# Patient Record
Sex: Male | Born: 1935 | Race: White | Hispanic: No | Smoking: Former smoker
Health system: Southern US, Community
[De-identification: ages and names within clinical notes are randomized; demographics above are authoritative.]

## PROBLEM LIST (undated history)

## (undated) DIAGNOSIS — Z8679 Personal history of other diseases of the circulatory system: Secondary | ICD-10-CM

## (undated) DIAGNOSIS — W19XXXA Unspecified fall, initial encounter: Secondary | ICD-10-CM

## (undated) DIAGNOSIS — N400 Enlarged prostate without lower urinary tract symptoms: Secondary | ICD-10-CM

## (undated) DIAGNOSIS — Z951 Presence of aortocoronary bypass graft: Secondary | ICD-10-CM

## (undated) DIAGNOSIS — R51 Headache: Secondary | ICD-10-CM

## (undated) DIAGNOSIS — I2510 Atherosclerotic heart disease of native coronary artery without angina pectoris: Secondary | ICD-10-CM

## (undated) DIAGNOSIS — I255 Ischemic cardiomyopathy: Secondary | ICD-10-CM

## (undated) DIAGNOSIS — R413 Other amnesia: Secondary | ICD-10-CM

## (undated) DIAGNOSIS — F0280 Dementia in other diseases classified elsewhere without behavioral disturbance: Secondary | ICD-10-CM

## (undated) DIAGNOSIS — D638 Anemia in other chronic diseases classified elsewhere: Secondary | ICD-10-CM

## (undated) DIAGNOSIS — I25719 Atherosclerosis of autologous vein coronary artery bypass graft(s) with unspecified angina pectoris: Secondary | ICD-10-CM

## (undated) DIAGNOSIS — G4486 Cervicogenic headache: Secondary | ICD-10-CM

## (undated) DIAGNOSIS — R269 Unspecified abnormalities of gait and mobility: Secondary | ICD-10-CM

## (undated) DIAGNOSIS — I10 Essential (primary) hypertension: Secondary | ICD-10-CM

## (undated) DIAGNOSIS — M47812 Spondylosis without myelopathy or radiculopathy, cervical region: Secondary | ICD-10-CM

## (undated) DIAGNOSIS — R296 Repeated falls: Secondary | ICD-10-CM

## (undated) DIAGNOSIS — G4700 Insomnia, unspecified: Secondary | ICD-10-CM

## (undated) DIAGNOSIS — E785 Hyperlipidemia, unspecified: Secondary | ICD-10-CM

## (undated) DIAGNOSIS — I482 Chronic atrial fibrillation, unspecified: Secondary | ICD-10-CM

## (undated) DIAGNOSIS — Z95810 Presence of automatic (implantable) cardiac defibrillator: Secondary | ICD-10-CM

## (undated) DIAGNOSIS — G309 Alzheimer's disease, unspecified: Secondary | ICD-10-CM

## (undated) HISTORY — PX: CORONARY ARTERY BYPASS GRAFT: SHX141

## (undated) HISTORY — DX: Benign prostatic hyperplasia without lower urinary tract symptoms: N40.0

## (undated) HISTORY — DX: Hyperlipidemia, unspecified: E78.5

## (undated) HISTORY — DX: Unspecified abnormalities of gait and mobility: R26.9

## (undated) HISTORY — DX: Essential (primary) hypertension: I10

## (undated) HISTORY — PX: OTHER SURGICAL HISTORY: SHX169

## (undated) HISTORY — DX: Repeated falls: R29.6

## (undated) HISTORY — DX: Other amnesia: R41.3

## (undated) HISTORY — PX: CORONARY STENT PLACEMENT: SHX1402

## (undated) HISTORY — DX: Unspecified fall, initial encounter: W19.XXXA

## (undated) HISTORY — DX: Dementia in other diseases classified elsewhere without behavioral disturbance: F02.80

## (undated) HISTORY — DX: Spondylosis without myelopathy or radiculopathy, cervical region: M47.812

## (undated) HISTORY — DX: Alzheimer's disease, unspecified: G30.9

## (undated) HISTORY — PX: TRANSURETHRAL RESECTION OF PROSTATE: SHX73

## (undated) HISTORY — DX: Cervicogenic headache: G44.86

## (undated) HISTORY — DX: Headache: R51

## (undated) HISTORY — DX: Insomnia, unspecified: G47.00

---

## 1985-12-27 DIAGNOSIS — Z951 Presence of aortocoronary bypass graft: Secondary | ICD-10-CM

## 1985-12-27 DIAGNOSIS — I2510 Atherosclerotic heart disease of native coronary artery without angina pectoris: Secondary | ICD-10-CM

## 1985-12-27 HISTORY — DX: Atherosclerotic heart disease of native coronary artery without angina pectoris: I25.10

## 1985-12-27 HISTORY — DX: Presence of aortocoronary bypass graft: Z95.1

## 2003-08-28 DIAGNOSIS — Z8679 Personal history of other diseases of the circulatory system: Secondary | ICD-10-CM

## 2003-08-28 HISTORY — PX: PACEMAKER PLACEMENT: SHX43

## 2003-08-28 HISTORY — DX: Personal history of other diseases of the circulatory system: Z86.79

## 2005-06-04 ENCOUNTER — Ambulatory Visit: Payer: Self-pay | Admitting: Internal Medicine

## 2005-06-13 ENCOUNTER — Emergency Department: Payer: Self-pay | Admitting: General Practice

## 2005-08-17 ENCOUNTER — Encounter: Payer: Self-pay | Admitting: Internal Medicine

## 2005-08-27 ENCOUNTER — Encounter: Payer: Self-pay | Admitting: Internal Medicine

## 2005-09-26 ENCOUNTER — Encounter: Payer: Self-pay | Admitting: Internal Medicine

## 2005-10-27 ENCOUNTER — Encounter: Payer: Self-pay | Admitting: Internal Medicine

## 2006-10-24 ENCOUNTER — Ambulatory Visit: Payer: Self-pay | Admitting: Gastroenterology

## 2007-02-20 ENCOUNTER — Inpatient Hospital Stay: Payer: Self-pay | Admitting: Internal Medicine

## 2007-02-20 ENCOUNTER — Other Ambulatory Visit: Payer: Self-pay

## 2008-01-11 ENCOUNTER — Inpatient Hospital Stay: Payer: Self-pay | Admitting: Internal Medicine

## 2008-01-11 ENCOUNTER — Other Ambulatory Visit: Payer: Self-pay

## 2009-09-22 ENCOUNTER — Inpatient Hospital Stay: Payer: Self-pay | Admitting: Otolaryngology

## 2010-02-02 ENCOUNTER — Ambulatory Visit: Payer: Self-pay | Admitting: Internal Medicine

## 2011-04-14 ENCOUNTER — Ambulatory Visit: Payer: Self-pay | Admitting: Internal Medicine

## 2011-11-04 ENCOUNTER — Ambulatory Visit: Payer: Self-pay | Admitting: Gastroenterology

## 2011-11-08 LAB — PATHOLOGY REPORT

## 2011-12-28 HISTORY — PX: CHOLECYSTECTOMY: SHX55

## 2012-01-06 ENCOUNTER — Ambulatory Visit: Payer: Self-pay | Admitting: Cardiology

## 2012-01-06 LAB — CBC WITH DIFFERENTIAL/PLATELET
Basophil #: 0 10*3/uL (ref 0.0–0.1)
HCT: 35.4 % — ABNORMAL LOW (ref 40.0–52.0)
Lymphocyte #: 1.4 10*3/uL (ref 1.0–3.6)
MCHC: 34.1 g/dL (ref 32.0–36.0)
Monocyte #: 0.6 10*3/uL (ref 0.0–0.7)
Neutrophil #: 3.5 10*3/uL (ref 1.4–6.5)
Platelet: 181 10*3/uL (ref 150–440)
RDW: 13.3 % (ref 11.5–14.5)
WBC: 6 10*3/uL (ref 3.8–10.6)

## 2012-01-06 LAB — BASIC METABOLIC PANEL
Anion Gap: 8 (ref 7–16)
BUN: 20 mg/dL — ABNORMAL HIGH (ref 7–18)
Calcium, Total: 9.1 mg/dL (ref 8.5–10.1)
Chloride: 106 mmol/L (ref 98–107)
Creatinine: 1.22 mg/dL (ref 0.60–1.30)
EGFR (African American): >60
EGFR (Non-African Amer.): >60
Glucose: 95 mg/dL (ref 65–99)

## 2012-01-10 ENCOUNTER — Ambulatory Visit: Payer: Self-pay | Admitting: Cardiology

## 2012-06-16 ENCOUNTER — Ambulatory Visit: Payer: Self-pay | Admitting: Internal Medicine

## 2012-07-26 LAB — CBC
HGB: 13.5 g/dL (ref 13.0–18.0)
MCH: 31.6 pg (ref 26.0–34.0)
MCV: 90 fL (ref 80–100)
Platelet: 255 10*3/uL (ref 150–440)
RDW: 13.2 % (ref 11.5–14.5)

## 2012-07-26 LAB — COMPREHENSIVE METABOLIC PANEL
Anion Gap: 6 — ABNORMAL LOW (ref 7–16)
Bilirubin,Total: 1.4 mg/dL — ABNORMAL HIGH (ref 0.2–1.0)
Chloride: 99 mmol/L (ref 98–107)
Co2: 30 mmol/L (ref 21–32)
Creatinine: 1.3 mg/dL (ref 0.60–1.30)
EGFR (African American): >60
EGFR (Non-African Amer.): 52 — ABNORMAL LOW
Osmolality: 274 (ref 275–301)
Potassium: 4.2 mmol/L (ref 3.5–5.1)
SGOT(AST): 26 U/L (ref 15–37)
SGPT (ALT): 26 U/L
Sodium: 135 mmol/L — ABNORMAL LOW (ref 136–145)

## 2012-07-26 LAB — URINALYSIS, COMPLETE
Bacteria: NONE SEEN
Blood: NEGATIVE
Glucose,UR: NEGATIVE mg/dL (ref 0–75)
Hyaline Cast: 8
Specific Gravity: 1.021 (ref 1.003–1.030)
WBC UR: 2 /HPF (ref 0–5)

## 2012-07-26 LAB — LIPASE, BLOOD: Lipase: 115 U/L (ref 73–393)

## 2012-07-26 LAB — CK TOTAL AND CKMB (NOT AT ARMC): CK-MB: 4.9 ng/mL — ABNORMAL HIGH (ref 0.5–3.6)

## 2012-07-27 ENCOUNTER — Inpatient Hospital Stay: Payer: Self-pay | Admitting: Surgery

## 2012-07-27 LAB — CK TOTAL AND CKMB (NOT AT ARMC)
CK, Total: 322 U/L — ABNORMAL HIGH (ref 35–232)
CK-MB: 4.4 ng/mL — ABNORMAL HIGH (ref 0.5–3.6)
CK-MB: 4.1 ng/mL — ABNORMAL HIGH (ref 0.5–3.6)

## 2012-07-27 LAB — TROPONIN I
Troponin-I: 0.06 ng/mL — ABNORMAL HIGH
Troponin-I: 0.06 ng/mL — ABNORMAL HIGH

## 2012-07-28 LAB — COMPREHENSIVE METABOLIC PANEL
Alkaline Phosphatase: 95 U/L (ref 50–136)
Anion Gap: 9 (ref 7–16)
Calcium, Total: 8 mg/dL — ABNORMAL LOW (ref 8.5–10.1)
Co2: 26 mmol/L (ref 21–32)
EGFR (Non-African Amer.): >60
Osmolality: 284 (ref 275–301)
Potassium: 3.6 mmol/L (ref 3.5–5.1)
SGPT (ALT): 21 U/L
Sodium: 143 mmol/L (ref 136–145)

## 2012-07-28 LAB — CBC WITH DIFFERENTIAL/PLATELET
Basophil #: 0.1 10*3/uL (ref 0.0–0.1)
Basophil %: 0.9 %
Eosinophil #: 0.1 10*3/uL (ref 0.0–0.7)
Eosinophil %: 2.6 %
HCT: 34.3 % — ABNORMAL LOW (ref 40.0–52.0)
HGB: 12.3 g/dL — ABNORMAL LOW (ref 13.0–18.0)
Lymphocyte #: 1.4 10*3/uL (ref 1.0–3.6)
Lymphocyte %: 24.5 %
MCH: 32.1 pg (ref 26.0–34.0)
MCHC: 35.8 g/dL (ref 32.0–36.0)
MCV: 90 fL (ref 80–100)
Neutrophil %: 60.4 %
Platelet: 182 10*3/uL (ref 150–440)
RBC: 3.82 10*6/uL — ABNORMAL LOW (ref 4.40–5.90)
RDW: 13 % (ref 11.5–14.5)
WBC: 5.6 10*3/uL (ref 3.8–10.6)

## 2012-07-28 LAB — MAGNESIUM: Magnesium: 1.8 mg/dL

## 2012-07-30 LAB — CBC WITH DIFFERENTIAL/PLATELET
Basophil %: 1.2 %
Eosinophil %: 4.3 %
HCT: 37 % — ABNORMAL LOW (ref 40.0–52.0)
HGB: 13.2 g/dL (ref 13.0–18.0)
Lymphocyte %: 30 %
MCH: 31.8 pg (ref 26.0–34.0)
MCHC: 35.6 g/dL (ref 32.0–36.0)
Monocyte %: 10.6 %
Neutrophil #: 4 10*3/uL (ref 1.4–6.5)
Neutrophil %: 53.9 %
Platelet: 184 10*3/uL (ref 150–440)
RBC: 4.13 10*6/uL — ABNORMAL LOW (ref 4.40–5.90)
WBC: 7.4 10*3/uL (ref 3.8–10.6)

## 2012-07-30 LAB — COMPREHENSIVE METABOLIC PANEL
Alkaline Phosphatase: 95 U/L (ref 50–136)
Bilirubin,Total: 0.8 mg/dL (ref 0.2–1.0)
Calcium, Total: 8.3 mg/dL — ABNORMAL LOW (ref 8.5–10.1)
Chloride: 108 mmol/L — ABNORMAL HIGH (ref 98–107)
Co2: 26 mmol/L (ref 21–32)
Creatinine: 0.88 mg/dL (ref 0.60–1.30)
EGFR (African American): >60
EGFR (Non-African Amer.): >60
Potassium: 3.3 mmol/L — ABNORMAL LOW (ref 3.5–5.1)
SGOT(AST): 27 U/L (ref 15–37)
SGPT (ALT): 21 U/L (ref 12–78)
Total Protein: 5.8 g/dL — ABNORMAL LOW (ref 6.4–8.2)

## 2012-07-31 LAB — BASIC METABOLIC PANEL
Anion Gap: 8 (ref 7–16)
Calcium, Total: 8.8 mg/dL (ref 8.5–10.1)
Chloride: 108 mmol/L — ABNORMAL HIGH (ref 98–107)
Co2: 24 mmol/L (ref 21–32)
EGFR (African American): >60
Osmolality: 278 (ref 275–301)

## 2012-08-02 LAB — COMPREHENSIVE METABOLIC PANEL
Anion Gap: 7 (ref 7–16)
BUN: 10 mg/dL (ref 7–18)
Calcium, Total: 8.4 mg/dL — ABNORMAL LOW (ref 8.5–10.1)
Chloride: 105 mmol/L (ref 98–107)
EGFR (African American): >60
Glucose: 78 mg/dL (ref 65–99)
Potassium: 4.3 mmol/L (ref 3.5–5.1)
SGOT(AST): 25 U/L (ref 15–37)
Sodium: 139 mmol/L (ref 136–145)
Total Protein: 5.9 g/dL — ABNORMAL LOW (ref 6.4–8.2)

## 2012-08-02 LAB — CBC WITH DIFFERENTIAL/PLATELET
Basophil #: 0.2 10*3/uL — ABNORMAL HIGH (ref 0.0–0.1)
Eosinophil #: 0.2 10*3/uL (ref 0.0–0.7)
HCT: 37.2 % — ABNORMAL LOW (ref 40.0–52.0)
HGB: 13 g/dL (ref 13.0–18.0)
Lymphocyte #: 0.7 10*3/uL — ABNORMAL LOW (ref 1.0–3.6)
MCH: 32.1 pg (ref 26.0–34.0)
MCHC: 35 g/dL (ref 32.0–36.0)
MCV: 92 fL (ref 80–100)
Monocyte #: 0.8 x10 3/mm (ref 0.2–1.0)
Monocyte %: 9.8 %
Neutrophil #: 6.5 10*3/uL (ref 1.4–6.5)
Platelet: 155 10*3/uL (ref 150–440)
RDW: 13.5 % (ref 11.5–14.5)
WBC: 8.4 10*3/uL (ref 3.8–10.6)

## 2012-08-02 LAB — PATHOLOGY REPORT

## 2012-08-04 LAB — CBC WITH DIFFERENTIAL/PLATELET
Basophil %: 1 %
Eosinophil %: 8.9 %
HCT: 33.4 % — ABNORMAL LOW (ref 40.0–52.0)
Lymphocyte #: 1 10*3/uL (ref 1.0–3.6)
MCH: 32.5 pg (ref 26.0–34.0)
MCHC: 35.9 g/dL (ref 32.0–36.0)
Monocyte #: 0.8 x10 3/mm (ref 0.2–1.0)
Monocyte %: 15 %
Neutrophil #: 3 10*3/uL (ref 1.4–6.5)
Neutrophil %: 56.1 %
Platelet: 169 10*3/uL (ref 150–440)
RDW: 13.5 % (ref 11.5–14.5)
WBC: 5.3 10*3/uL (ref 3.8–10.6)

## 2012-08-04 LAB — BASIC METABOLIC PANEL
Calcium, Total: 8.4 mg/dL — ABNORMAL LOW (ref 8.5–10.1)
Chloride: 103 mmol/L (ref 98–107)
Co2: 27 mmol/L (ref 21–32)
EGFR (African American): >60
EGFR (Non-African Amer.): >60
Potassium: 4.5 mmol/L (ref 3.5–5.1)
Sodium: 136 mmol/L (ref 136–145)

## 2012-08-04 LAB — HEPATIC FUNCTION PANEL A (ARMC)
Bilirubin,Total: 0.8 mg/dL (ref 0.2–1.0)
SGOT(AST): 23 U/L (ref 15–37)

## 2012-08-04 LAB — LIPASE, BLOOD: Lipase: 160 U/L

## 2012-08-08 ENCOUNTER — Encounter: Payer: Self-pay | Admitting: Internal Medicine

## 2012-08-27 ENCOUNTER — Encounter: Payer: Self-pay | Admitting: Internal Medicine

## 2012-09-19 ENCOUNTER — Ambulatory Visit: Payer: Self-pay | Admitting: Urology

## 2013-02-04 ENCOUNTER — Emergency Department: Payer: Self-pay | Admitting: Emergency Medicine

## 2013-02-04 LAB — CBC
HCT: 37.6 % — ABNORMAL LOW (ref 40.0–52.0)
HGB: 12.7 g/dL — ABNORMAL LOW (ref 13.0–18.0)
MCH: 29.8 pg (ref 26.0–34.0)
MCHC: 33.8 g/dL (ref 32.0–36.0)
MCV: 88 fL (ref 80–100)
RBC: 4.27 10*6/uL — ABNORMAL LOW (ref 4.40–5.90)
RDW: 16 % — ABNORMAL HIGH (ref 11.5–14.5)

## 2013-02-04 LAB — COMPREHENSIVE METABOLIC PANEL
Albumin: 3.4 g/dL (ref 3.4–5.0)
Alkaline Phosphatase: 101 U/L (ref 50–136)
Bilirubin,Total: 0.9 mg/dL (ref 0.2–1.0)
Chloride: 108 mmol/L — ABNORMAL HIGH (ref 98–107)
EGFR (African American): >60
EGFR (Non-African Amer.): >60
Glucose: 85 mg/dL (ref 65–99)
Osmolality: 281 (ref 275–301)
Potassium: 3.8 mmol/L (ref 3.5–5.1)
SGOT(AST): 24 U/L (ref 15–37)
SGPT (ALT): 29 U/L (ref 12–78)

## 2013-02-04 LAB — URINALYSIS, COMPLETE
Bacteria: NONE SEEN
Bilirubin,UR: NEGATIVE
Ph: 5 (ref 4.5–8.0)
Protein: NEGATIVE
Specific Gravity: 1.005 (ref 1.003–1.030)

## 2013-02-04 LAB — TROPONIN I: Troponin-I: <0.02 ng/mL

## 2013-05-22 ENCOUNTER — Other Ambulatory Visit: Payer: Self-pay

## 2013-05-22 MED ORDER — DONEPEZIL HCL 5 MG PO TABS: 5.0000 mg | ORAL_TABLET | Freq: Every evening | ORAL | Status: DC

## 2013-07-31 ENCOUNTER — Ambulatory Visit: Payer: Self-pay | Admitting: Ophthalmology

## 2013-08-06 ENCOUNTER — Ambulatory Visit: Payer: Self-pay | Admitting: Ophthalmology

## 2013-09-21 ENCOUNTER — Ambulatory Visit: Payer: Self-pay | Admitting: Urology

## 2014-02-20 ENCOUNTER — Ambulatory Visit: Payer: Self-pay | Admitting: Ophthalmology

## 2014-03-04 ENCOUNTER — Ambulatory Visit: Payer: Self-pay | Admitting: Ophthalmology

## 2014-03-25 ENCOUNTER — Telehealth: Payer: Self-pay | Admitting: Neurology

## 2014-03-25 MED ORDER — DONEPEZIL HCL 5 MG PO TABS: 5.0000 mg | ORAL_TABLET | Freq: Every evening | ORAL | Status: DC

## 2014-03-25 NOTE — Telephone Encounter (Signed)
Rx has been sent.  I called the patient back 3 times.  The line was busy each time.

## 2014-03-25 NOTE — Telephone Encounter (Signed)
Scheduled apt for pt in July needs refill on medication donepezil (ARICEPT) 5 MG tablet. Please call once this has been sent to pharmacy and pharmacy is the same as listed. Thanks

## 2014-04-11 ENCOUNTER — Encounter: Payer: Self-pay | Admitting: Neurology

## 2014-04-17 ENCOUNTER — Telehealth: Payer: Self-pay | Admitting: Neurology

## 2014-04-17 NOTE — Telephone Encounter (Signed)
Patient's son calling to request a written diagnosis for patient. Please call and advise.

## 2014-04-18 ENCOUNTER — Encounter: Payer: Self-pay | Admitting: Neurology

## 2014-04-18 NOTE — Telephone Encounter (Signed)
I will write a letter. I called the son. He'll come by and pick up a letter.

## 2014-04-18 NOTE — Telephone Encounter (Signed)
Pt's son Aurther Lofterry calling requesting a written diagnosis on the pt. Please advise

## 2014-07-03 ENCOUNTER — Ambulatory Visit: Payer: Self-pay | Admitting: Neurology

## 2014-09-04 ENCOUNTER — Inpatient Hospital Stay: Payer: Self-pay | Admitting: Internal Medicine

## 2014-09-04 LAB — CBC
HCT: 43 % (ref 40.0–52.0)
HGB: 14.3 g/dL (ref 13.0–18.0)
MCH: 31.5 pg (ref 26.0–34.0)
MCHC: 33.3 g/dL (ref 32.0–36.0)
MCV: 95 fL (ref 80–100)
PLATELETS: 152 10*3/uL (ref 150–440)
RBC: 4.54 10*6/uL (ref 4.40–5.90)
RDW: 13.4 % (ref 11.5–14.5)
WBC: 7.6 10*3/uL (ref 3.8–10.6)

## 2014-09-04 LAB — COMPREHENSIVE METABOLIC PANEL
ALK PHOS: 102 U/L
ANION GAP: 5 — AB (ref 7–16)
Albumin: 3.5 g/dL (ref 3.4–5.0)
BUN: 15 mg/dL (ref 7–18)
Bilirubin,Total: 0.9 mg/dL (ref 0.2–1.0)
CALCIUM: 9.4 mg/dL (ref 8.5–10.1)
CO2: 31 mmol/L (ref 21–32)
Chloride: 101 mmol/L (ref 98–107)
Creatinine: 1.03 mg/dL (ref 0.60–1.30)
EGFR (African American): >60
EGFR (Non-African Amer.): >60
Glucose: 113 mg/dL — ABNORMAL HIGH (ref 65–99)
Osmolality: 275 (ref 275–301)
POTASSIUM: 3.6 mmol/L (ref 3.5–5.1)
SGOT(AST): 16 U/L (ref 15–37)
SGPT (ALT): 27 U/L
Sodium: 137 mmol/L (ref 136–145)
TOTAL PROTEIN: 6.7 g/dL (ref 6.4–8.2)

## 2014-09-04 LAB — PROTIME-INR
INR: 0.9
PROTHROMBIN TIME: 12.5 s (ref 11.5–14.7)

## 2014-09-04 LAB — CK-MB
CK-MB: 2.6 ng/mL (ref 0.5–3.6)
CK-MB: 3.4 ng/mL (ref 0.5–3.6)
CK-MB: 4.8 ng/mL — AB (ref 0.5–3.6)

## 2014-09-04 LAB — TROPONIN I
TROPONIN-I: 0.05 ng/mL
Troponin-I: 0.15 ng/mL — ABNORMAL HIGH
Troponin-I: 0.18 ng/mL — ABNORMAL HIGH

## 2014-09-04 LAB — APTT: ACTIVATED PTT: 30.1 s (ref 23.6–35.9)

## 2014-09-05 LAB — PROTIME-INR
INR: 1
Prothrombin Time: 13.3 secs (ref 11.5–14.7)

## 2014-09-05 LAB — BASIC METABOLIC PANEL
ANION GAP: 9 (ref 7–16)
BUN: 15 mg/dL (ref 7–18)
CALCIUM: 9.4 mg/dL (ref 8.5–10.1)
Chloride: 104 mmol/L (ref 98–107)
Co2: 28 mmol/L (ref 21–32)
Creatinine: 1.13 mg/dL (ref 0.60–1.30)
EGFR (African American): >60
EGFR (Non-African Amer.): >60
Glucose: 94 mg/dL (ref 65–99)
OSMOLALITY: 282 (ref 275–301)
POTASSIUM: 3.8 mmol/L (ref 3.5–5.1)
SODIUM: 141 mmol/L (ref 136–145)

## 2014-09-05 LAB — CBC WITH DIFFERENTIAL/PLATELET
BASOS PCT: 2.3 %
Basophil #: 0.2 10*3/uL — ABNORMAL HIGH (ref 0.0–0.1)
EOS ABS: 0.6 10*3/uL (ref 0.0–0.7)
EOS PCT: 8.5 %
HCT: 39.7 % — ABNORMAL LOW (ref 40.0–52.0)
HGB: 13.3 g/dL (ref 13.0–18.0)
LYMPHS PCT: 24.6 %
Lymphocyte #: 1.9 10*3/uL (ref 1.0–3.6)
MCH: 31.4 pg (ref 26.0–34.0)
MCHC: 33.4 g/dL (ref 32.0–36.0)
MCV: 94 fL (ref 80–100)
MONOS PCT: 13.7 %
Monocyte #: 1 x10 3/mm (ref 0.2–1.0)
NEUTROS ABS: 3.8 10*3/uL (ref 1.4–6.5)
Neutrophil %: 50.9 %
Platelet: 151 10*3/uL (ref 150–440)
RBC: 4.22 10*6/uL — ABNORMAL LOW (ref 4.40–5.90)
RDW: 13.2 % (ref 11.5–14.5)
WBC: 7.6 10*3/uL (ref 3.8–10.6)

## 2014-09-05 LAB — HEPARIN LEVEL (UNFRACTIONATED): Anti-Xa(Unfractionated): 0.58 IU/mL (ref 0.30–0.70)

## 2014-09-06 ENCOUNTER — Ambulatory Visit: Payer: Self-pay | Admitting: Neurology

## 2014-09-06 LAB — BASIC METABOLIC PANEL
Anion Gap: 7 (ref 7–16)
BUN: 17 mg/dL (ref 7–18)
CALCIUM: 8.6 mg/dL (ref 8.5–10.1)
Chloride: 103 mmol/L (ref 98–107)
Co2: 29 mmol/L (ref 21–32)
Creatinine: 1.19 mg/dL (ref 0.60–1.30)
GFR CALC NON AF AMER: 57 — AB
Glucose: 97 mg/dL (ref 65–99)
Osmolality: 279 (ref 275–301)
POTASSIUM: 4.3 mmol/L (ref 3.5–5.1)
Sodium: 139 mmol/L (ref 136–145)

## 2014-09-06 LAB — CBC WITH DIFFERENTIAL/PLATELET
BASOS ABS: 0.1 10*3/uL (ref 0.0–0.1)
BASOS PCT: 1.3 %
EOS PCT: 6 %
Eosinophil #: 0.6 10*3/uL (ref 0.0–0.7)
HCT: 43.3 % (ref 40.0–52.0)
HGB: 15.2 g/dL (ref 13.0–18.0)
Lymphocyte #: 1.7 10*3/uL (ref 1.0–3.6)
Lymphocyte %: 16.3 %
MCH: 32.4 pg (ref 26.0–34.0)
MCHC: 35 g/dL (ref 32.0–36.0)
MCV: 93 fL (ref 80–100)
MONO ABS: 0.9 x10 3/mm (ref 0.2–1.0)
Monocyte %: 8.6 %
NEUTROS PCT: 67.8 %
Neutrophil #: 7 10*3/uL — ABNORMAL HIGH (ref 1.4–6.5)
PLATELETS: 158 10*3/uL (ref 150–440)
RBC: 4.68 10*6/uL (ref 4.40–5.90)
RDW: 13.6 % (ref 11.5–14.5)
WBC: 10.3 10*3/uL (ref 3.8–10.6)

## 2014-09-06 LAB — HEPARIN LEVEL (UNFRACTIONATED)

## 2014-09-24 ENCOUNTER — Encounter: Payer: Self-pay | Admitting: Neurology

## 2014-09-24 ENCOUNTER — Encounter (INDEPENDENT_AMBULATORY_CARE_PROVIDER_SITE_OTHER): Payer: Self-pay

## 2014-09-24 ENCOUNTER — Ambulatory Visit (INDEPENDENT_AMBULATORY_CARE_PROVIDER_SITE_OTHER): Payer: Commercial Managed Care - HMO | Admitting: Neurology

## 2014-09-24 VITALS — BP 135/64 | HR 87 | Ht 68.7 in | Wt 152.0 lb

## 2014-09-24 DIAGNOSIS — R413 Other amnesia: Secondary | ICD-10-CM

## 2014-09-24 HISTORY — DX: Other amnesia: R41.3

## 2014-09-24 MED ORDER — DONEPEZIL HCL 10 MG PO TABS: 10.0000 mg | ORAL_TABLET | Freq: Every day | ORAL | Status: DC

## 2014-09-24 NOTE — Progress Notes (Signed)
Reason for visit: Memory disturbance  Nathan Saunders is an 80 y.o. male  History of present illness:  Nathan Saunders is an 80 year old left-handed white male with a history of a progressive memory disorder. The patient was last seen through this office on 07/07/2012. He was placed on Aricept at 5 mg dose, but he has not returned to the office since that time. The patient lives at home, and his son lives with him. He needs assistance with keeping up with medications and appointments, and with financial issues. The patient apparently still operates a motor vehicle without difficulty. The patient will drive to church and to see his sister. The patient comes to the office with his son who indicates that his driving safety is not an issue. The patient is tolerating the Aricept well, and the patient remains active doing yard work and housework. There has been no family history of memory disorders.  Past Medical History  Diagnosis Date  . Memory disturbance     progressive  . Hypertension   . Dyslipidemia   . CAD (coronary artery disease)   . Atrial fibrillation   . Benign enlargement of prostate   . Insomnia   . Cervical spondylosis   . Gait disturbance   . Cervicogenic headache   . Memory difficulty 09/24/2014    Past Surgical History  Procedure Laterality Date  . Coronary artery bypass graft    . Transurethral resection of prostate    . Hernia repair Bilateral   . Coronary stent placement    . Pacemaker placement      Family History  Problem Relation Age of Onset  . Heart disease Mother   . Heart disease Father   . Cancer Brother   . Dementia Neg Hx   . Heart disease Brother     Social history:  reports that he has quit smoking. His smoking use included Cigarettes. He has a 15 pack-year smoking history. He has never used smokeless tobacco. He reports that he does not drink alcohol or use illicit drugs.    Allergies  Allergen Reactions  . Serotonin Reuptake Inhibitors  (Ssris)     Other reaction(s): Other (See Comments) Nightmares    Medications:  No current outpatient prescriptions on file prior to visit.   No current facility-administered medications on file prior to visit.    ROS:  Out of a complete 14 system review of symptoms, the patient complains only of the following symptoms, and all other reviewed systems are negative.  Chest pain Memory loss  Blood pressure 135/64, pulse 87, height 5' 8.7" (1.745 m), weight 152 lb (68.947 kg).  Physical Exam  General: The patient is alert and cooperative at the time of the examination.  Skin: No significant peripheral edema is noted.   Neurologic Exam  Mental status: The Mini-Mental status examination done today shows a total score of 15/30.  Cranial nerves: Facial symmetry is present. Speech is normal, no aphasia or dysarthria is noted. Extraocular movements are full. Visual fields are full.  Motor: The patient has good strength in all 4 extremities.  Sensory examination: Soft touch sensation is symmetric on the face, arms, and legs.  Coordination: The patient has good finger-nose-finger bilaterally. The patient has significant apraxia with the use of the lower extremities, unable to perform heel-to-shin on either side.  Gait and station: The patient has a normal gait. Tandem gait is slightly unsteady. Romberg is negative. No drift is seen.  Reflexes: Deep tendon reflexes are  symmetric.   Assessment/Plan:  1. Progressive memory disorder  The patient will continue the Aricept, but the dose will be increased to 10 mg nightly dose. The patient will followup through this office in 6-8 months. I have indicated that the driving issue needs to be scrutinized closely, and this activity should be withdrawn at the first sign of problems. The patient will call our office if he is not tolerating higher dose of Aricept.  Marlan Palau MD 09/24/2014 7:17 PM  [facility] [address] [place], [place] [address]  Phone [phone] Fax [phone]

## 2014-11-13 ENCOUNTER — Encounter: Payer: Self-pay | Admitting: Neurology

## 2014-11-19 ENCOUNTER — Encounter: Payer: Self-pay | Admitting: Neurology

## 2015-01-25 ENCOUNTER — Ambulatory Visit: Payer: Self-pay | Admitting: Family Medicine

## 2015-01-25 DIAGNOSIS — S0990XA Unspecified injury of head, initial encounter: Secondary | ICD-10-CM | POA: Diagnosis not present

## 2015-01-25 DIAGNOSIS — S0512XA Contusion of eyeball and orbital tissues, left eye, initial encounter: Secondary | ICD-10-CM | POA: Diagnosis not present

## 2015-01-25 DIAGNOSIS — R220 Localized swelling, mass and lump, head: Secondary | ICD-10-CM | POA: Diagnosis not present

## 2015-01-28 DIAGNOSIS — I482 Chronic atrial fibrillation: Secondary | ICD-10-CM | POA: Diagnosis not present

## 2015-01-28 DIAGNOSIS — I25709 Atherosclerosis of coronary artery bypass graft(s), unspecified, with unspecified angina pectoris: Secondary | ICD-10-CM | POA: Diagnosis not present

## 2015-01-28 DIAGNOSIS — R4182 Altered mental status, unspecified: Secondary | ICD-10-CM | POA: Diagnosis not present

## 2015-01-28 DIAGNOSIS — E782 Mixed hyperlipidemia: Secondary | ICD-10-CM | POA: Diagnosis not present

## 2015-02-03 DIAGNOSIS — I25709 Atherosclerosis of coronary artery bypass graft(s), unspecified, with unspecified angina pectoris: Secondary | ICD-10-CM | POA: Diagnosis not present

## 2015-02-03 DIAGNOSIS — E782 Mixed hyperlipidemia: Secondary | ICD-10-CM | POA: Diagnosis not present

## 2015-02-03 DIAGNOSIS — I10 Essential (primary) hypertension: Secondary | ICD-10-CM | POA: Diagnosis not present

## 2015-02-03 DIAGNOSIS — I482 Chronic atrial fibrillation: Secondary | ICD-10-CM | POA: Diagnosis not present

## 2015-02-07 DIAGNOSIS — R079 Chest pain, unspecified: Secondary | ICD-10-CM | POA: Diagnosis not present

## 2015-02-07 DIAGNOSIS — I10 Essential (primary) hypertension: Secondary | ICD-10-CM | POA: Diagnosis not present

## 2015-02-07 DIAGNOSIS — I25709 Atherosclerosis of coronary artery bypass graft(s), unspecified, with unspecified angina pectoris: Secondary | ICD-10-CM | POA: Diagnosis not present

## 2015-02-07 DIAGNOSIS — E782 Mixed hyperlipidemia: Secondary | ICD-10-CM | POA: Diagnosis not present

## 2015-02-20 DIAGNOSIS — I442 Atrioventricular block, complete: Secondary | ICD-10-CM | POA: Diagnosis not present

## 2015-03-25 ENCOUNTER — Ambulatory Visit: Payer: Commercial Managed Care - HMO | Admitting: Neurology

## 2015-03-27 ENCOUNTER — Telehealth: Payer: Self-pay

## 2015-03-27 NOTE — Telephone Encounter (Signed)
Left voicemail for patient to call back to schedule an appointment with Aundra MilletMegan, NP.

## 2015-03-31 NOTE — Telephone Encounter (Signed)
Appointment scheduled for 04/18/15 °

## 2015-04-01 DIAGNOSIS — I10 Essential (primary) hypertension: Secondary | ICD-10-CM | POA: Diagnosis not present

## 2015-04-01 DIAGNOSIS — Z79899 Other long term (current) drug therapy: Secondary | ICD-10-CM | POA: Diagnosis not present

## 2015-04-01 DIAGNOSIS — E782 Mixed hyperlipidemia: Secondary | ICD-10-CM | POA: Diagnosis not present

## 2015-04-01 DIAGNOSIS — I25709 Atherosclerosis of coronary artery bypass graft(s), unspecified, with unspecified angina pectoris: Secondary | ICD-10-CM | POA: Diagnosis not present

## 2015-04-01 DIAGNOSIS — R0602 Shortness of breath: Secondary | ICD-10-CM | POA: Diagnosis not present

## 2015-04-15 NOTE — Op Note (Signed)
PATIENT NAME:  [name], [name] MR#:  [id number] DATE OF BIRTH:  1932-08-04  DATE OF PROCEDURE:  07/31/2012  PREOPERATIVE DIAGNOSIS: Chronic cholecystitis and cholelithiasis.   POSTOPERATIVE DIAGNOSIS: Chronic cholecystitis and cholelithiasis.  OPERATION: Laparoscopic cholecystectomy with cholangiography.   SURGEON: Quentin Orealph L. Ely, III, MD   ANESTHESIA: General.   OPERATIVE PROCEDURE: With the patient in the supine position after the induction of appropriate general anesthesia, the patient's abdomen was prepped with ChloraPrep and draped with sterile towels. An infraumbilical incision was made in the standard fashion, carried down bluntly through the subcutaneous tissue. Veress needle was used to cannulate the peritoneal cavity and CO2 was insufflated to appropriate pressure measurements. When approximately 2.5 liters of CO2 were instilled, the Veress needle was withdrawn. An 11 mm [employer] port was inserted into the peritoneal cavity. Intraperitoneal position was confirmed and CO2 was re-insufflated. The patient was placed in the head up, feet down position, rotated slightly to the left side. A subxiphoid transverse incision was made and an 11 mm port was inserted under direct vision. Two lateral ports 5 mm in size were inserted under direct vision. The gallbladder was markedly distended and discolored. Several adhesions were taken down with a combination of blunt and Bovie dissection. The gallbladder was retracted superiorly and laterally exposing the hepatoduodenal ligament. Cystic artery and cystic duct were identified. Cystic duct was clipped on the gallbladder side and opened. An on table cholangiogram using dynamic fluoroscopy revealed free flow of dye into the duodenum. Intrahepatic radicles were seen. No obstruction was identified. Catheter was withdrawn. The cystic duct was doubly clipped on the common duct side and divided. Cystic artery was doubly clipped and divided. The gallbladder was  then dissected free from its bed and delivered using hook and cautery apparatus. Once the gallbladder was free, the camera was switched to the subxiphoid port and the gallbladder was brought through the umbilical port using the EndoCatch apparatus. The area was copiously suctioned and irrigated. There did not appear to be any significant bleeding problems from the bed of the liver. The midline fascial sutures were closed with interrupted sutures of 0 Vicryl. Skin was closed with 5-0 nylon. The area was infiltrated with 0.25% Marcaine for postoperative pain control. Sterile dressings were applied.   The patient was returned to the recovery room having tolerated the procedure well. Sponge, instrument, and needle counts were correct x2 in the operating room.    ____________________________ Quentin Orealph L. Ely III, MD rle:drc D: 07/31/2012 13:09:16 ET T: 07/31/2012 13:16:58 ET JOB#: [id number]  cc: Carmie Endalph L. Ely III, MD, <Dictator> Lamar BlinksBruce J. Kowalski, MD Duane LopeJeffrey D. Judithann SheenSparks, MD Quentin OreALPH L ELY MD ELECTRONICALLY SIGNED 08/03/2012 22:33

## 2015-04-15 NOTE — H&P (Signed)
PATIENT NAME:  Nathan Saunders, Nathan Saunders MR#:  [id number] DATE OF BIRTH:  02/04/1932  DATE OF ADMISSION:  07/27/2012  PRIMARY CARE PHYSICIAN: Aram Beecham, MD  CHIEF COMPLAINT: Nausea and vomiting.   SUBJECTIVE: This is a 81 year old male with a history of coronary artery disease, history of hypertension, dyslipidemia, and benign prostatic hypertrophy who presented to the ER with two days worth of nausea and vomiting. The patient had two episodes of bilious vomiting yesterday associated with constipation over the last 5 to 6 days. The patient is on Xarelto at home for his history of atrial fibrillation as well as Plavix and currently does not report any chest pain or any shortness of breath. He also has a history of having a pacemaker replacement early this year. He does not report any syncope or near syncope. However, he has had a couple of episodes yesterday where he has had fluttering in his chest.   He denies any fever, chills, or rigors. He denies any blood in his stool or black, tarry stool. However, has been constipated for about 5 or 6 days. He has also lost 10 pounds in the last month or so.  PAST MEDICAL HISTORY:  1. Coronary artery disease, status post bypass in 1987. 2. Hypertension.  3. Dyslipidemia.  4. Benign prostatic hypertrophy. 5. Transurethral resection of the prostate. 6. Environmental allergies. 7. Chronic anemia. 8. Insomnia.   PAST SURGICAL HISTORY:  1. History of CABG in 1987 status post transurethral resection of the prostate. 2. Bilateral inguinal hernia repair. 3. Status post percutaneous transluminal coronary artery angioplasty.    CURRENT MEDICATIONS:  1. Norvasc 5 mg p.o. daily.  2. Aricept 5 mg p.o. daily. 3. Atorvastatin 40 mg p.o. daily.  4. Plavix 75 mg p.o. daily.  5. Cyclobenzaprine 10 mg p.o. daily.  6. Imdur 60 mg p.o. daily.  7. Lexapro 5 mg p.o. daily. 8. Lorazepam 1 mg p.o. daily.  9. Meloxicam 7.5 mg p.o. daily.  10. Nitroglycerin 0.4 mg  sublingual as needed.  11. Prilosec 20 mg p.o. daily. 12. Cyclobenzaprine 10 mg p.o. daily.  13. Toprol-XL 100 mg p.o. daily.  14. Xarelto 15 mg p.o. daily.   ALLERGIES: No known drug allergies.   REVIEW OF SYSTEMS: CONSTITUTIONAL: No fever, fatigue, or weakness, but 10 pound weight loss. EYES: No blurry vision, double vision, pain, redness, inflammation, glaucoma, or cataracts. ENT: No tinnitus, ear pain, hearing loss, or seasonal allergies. RESPIRATORY: No cough, wheezing, hemoptysis, dyspnea, or asthma. CARDIOVASCULAR: No chest pain, orthopnea, edema, or arrhythmias. GASTROINTESTINAL: Positive for nausea, vomiting and constipation. No abdominal pain. GENITOURINARY: No dysuria, hematuria, or renal calculi. ENDOCRINE: No polyuria, nocturia, thyroid problems, or increased sweating. INTEGUMENTARY: No acne, rash, or lesions. MUSCULOSKELETAL: No pain in neck, back, shoulders, knees, or hip. NEUROLOGIC: No numbness, weakness, dysarthria, epilepsy, tremor, or vertigo. PSYCH: No anxiety, insomnia, schizophrenia, or nervousness.   SOCIAL HISTORY: The patient lives with his son who is the primary caregiver.   FAMILY HISTORY: Both parents died of myocardial infarctions, mother at 70 and father at 74.  SOCIAL HISTORY: He smoked a pack per day x50 years. He quit in 1987.   ALLERGIES: No known drug allergies.     PHYSICAL EXAMINATION:   VITAL SIGNS: Blood pressure 133/65, respirations 18, and pulse 60.   GENERAL: Comfortable, in no acute cardiopulmonary distress.   HEENT: Pupils are equal and reactive. Extraocular movements are intact.   NECK: Supple. No JVD.   LUNGS: Clear to auscultation bilaterally. No wheezes or crackles  or rhonchi.   CARDIOVASCULAR: Regular rate and rhythm. No murmurs, rubs, or gallops.   ABDOMEN: Obese, soft, nontender, and nondistended. Murphy sign is negative.   NEUROLOGIC: Cranial nerves II through XII grossly intact. Deep tendon reflexes 2+ bilaterally. Motor  strength intact in bilateral upper and lower extremities.   PSYCHIATRIC: Appropriate mood and affect.   LYMPHATIC: No inguinal or cervical lymphadenopathy.   LABORATORY, DIAGNOSTIC AND RADIOLOGIC DATA: CT scan of the abdomen and pelvis shows no acute pathology.   CT scan of the head without contrast shows no acute intracranial process.  Abdominal x-ray shows cannot rule out gallstones. Bilateral inguinal hernia repair. Aortic and iliac vascular disease. Cardiac pacemaker.  Glucose 95, BUN 23, creatinine 1.30, serum sodium 135, potassium 4.2, chloride 99, alkaline phosphatase 124, ALT 26, and AST 26. Lipase 115. WBC 10.7, hemoglobin 13.5, hematocrit 38.5, and platelet count 255.   Urinalysis: 2 WBCs per high-power field, negative for nitrite and leukocyte esterase.   ASSESSMENT:  1. Cholelithiasis. 2. Constipation.  3. Acute renal insufficiency secondary to dehydration.  4. Atrial fibrillation, rate controlled on anticoagulation.   PLAN: The patient will be admitted to telemetry given the fact that he has a history of atrial fibrillation.   We will hold the Xarelto and Plavix in anticipation of surgery, possibly in the next 48 hours, once the Xarelto resolves.   The patient will be continued on perioperative beta blocker.  We will start the patient on IV fluids.   Keep the patient n.p.o. because of his nausea and vomiting.   Dr. Anda KraftMarterre has already seen the patient and will continue to follow along.   He is a FULL CODE.   TIME SPENT:  70 minutes. ____________________________ Richarda OverlieNayana Vernel Donlan, MD na:slb D: 07/27/2012 05:04:25 ET T: 07/27/2012 08:34:52 ET JOB#: [id number]  cc: Richarda OverlieNayana Glade Strausser, MD, <Dictator> Duane LopeJeffrey D. Judithann SheenSparks, MD Richarda OverlieNAYANA Desma Wilkowski MD ELECTRONICALLY SIGNED 07/28/2012 23:59

## 2015-04-15 NOTE — Consult Note (Signed)
Brief Consult Note: Diagnosis: non-mobile gallstone causing nausea, vomiting, anorexia.   Patient was seen by consultant.   Consult note dictated.   Recommend further assessment or treatment.   Discussed with Attending MD.   Comments: Surgery could be contemplated after patient has been off Xarelto for 48 hours, during which time medical clearance for semi-elective surgery coud be obtained.  Will follow.  Electronic Signatures: Claude MangesMarterre, Winna Golla F (MD)  (Signed 01-Aug-13 03:01)  Authored: Brief Consult Note   Last Updated: 01-Aug-13 03:01 by Claude MangesMarterre, Railynn Ballo F (MD)

## 2015-04-15 NOTE — Consult Note (Signed)
PATIENT NAME:  Nathan Saunders, Nathan Saunders MR#:  [id number] DATE OF BIRTH:  [birth year]  DATE OF CONSULTATION:  07/27/2012  REFERRING PHYSICIAN:   CONSULTING PHYSICIAN:  Claude MangesWilliam F. Dawna Jakes, MD  HISTORY OF PRESENT ILLNESS: Mr. [name] is a 81 year old white male who comes into the Emergency Department because of a 3 to 4 day history of nausea, vomiting, anorexia, and a seven day history of constipation. He is found to have a nonmobile gallstone stuck in the neck of his gallbladder associated with gallbladder sludge, normal gallbladder wall thickness, and 5 mm common bile duct, and negative sonographic Murphy's sign on ultrasound examination. He does not have fever, chills, abdominal pain, change in the color of his urine, stool, eyes, or skin. History is obtained from the patient with significant assistance from the son as the patient does have some memory deficit. The patient underwent EGD and colonoscopy within the last year for Hemoccult positive stool and these were essentially negative with the exception of a very small polyp in the colon that was removed.   PAST MEDICAL HISTORY:  1. Atherosclerotic cardiovascular disease, status post coronary artery bypass grafting and stent placements.  2. Hypertension.  3. Dyslipidemia. 4. Benign prostatic hypertrophy, status post TURP.  5. Chronic anemia.  6. Chronic insomnia.  7. Recent institution of Aricept for dementia of unknown etiology.   ALLERGIES: None.   MEDICATIONS:  1. Allegra 180 mg q.a.m.  2. Amlodipine 5 mg q.a.m.  3. Aspirin 325 mg q.a.m.  4. Atorvastatin 40 mg at bedtime.  5. Avapro 150 mg q.a.m.  6. Plavix (discontinued January 2013). 7. Cyclobenzaprine 10 mg q.p.m.  8. Isosorbide mononitrate 60 mg q.a.m.  9. Lorazepam 1 mg at bedtime.  10. Meloxicam 7.5 mg q.a.m.  11. Nitroglycerin 0.4 mg sublingual p.r.n. 12. Prilosec 20 mg q.a.m.  13. Ramipril 10 mg q.a.m.  14. Toprol-XL 100 mg q.a.m.  15. Xarelto, unknown dose, q.a.m. (last taken  Wednesday morning, missed his dose Tuesday morning). 16. Aricept, unknown dose, begun a few weeks ago.  REVIEW OF SYSTEMS: Negative for 10 systems except as mentioned above in the history of present illness. The patient does have about a six month history of memory deficit according to the son.   FAMILY HISTORY: Noncontributory.   SOCIAL HISTORY: The patient lives at his own home but his son has been living with him recently. He quit smoking about 25 years ago and does not drink alcohol. He is retired from Warehouse managerclerical work.   PHYSICAL EXAMINATION:   GENERAL: Very pleasant elderly white male lying comfortably on the Emergency Department stretcher in no acute distress. Height 5 feet 10 inches, weight 138 pounds, BMI 19.8.   VITAL SIGNS: Temperature 98.0, pulse 74, respirations 18, blood pressure 140/72, oxygen saturation 97%.   HEENT: Pupils equally round and reactive to light. Extraocular movements intact. Sclerae nonicteric. Oropharynx clear. Mucous membranes moist.   NECK: Supple with no tracheal deviation or jugular venous distention.   HEART: Regular rate and rhythm with no murmurs or rubs.   LUNGS: Clear to auscultation with normal respiratory effort bilaterally.   ABDOMEN: Soft, flat, nondistended, nontender even to deep palpation in the right upper quadrant.   EXTREMITIES: No edema with normal capillary refill bilaterally.   NEUROLOGIC: Cranial nerves II through XII, motor and sensation grossly intact.   PSYCHIATRIC: The patient is pleasant and alert and oriented but does have an obvious memory deficit in that he cannot remember things that have occurred within the last day or two  and asks his son for help during the history.   LABORATORY, DIAGNOSTIC, AND RADIOLOGICAL DATA: White blood cell count 10.7, hemoglobin 13.5, hematocrit 38.5%, platelet count 255,000. Urinalysis is normal. Electrolytes essentially normal with the exception of a BUN of 23 and creatinine of 1.3. His lipase  and hepatic profile are normal with the exception of a bilirubin of 1.4.   CT scan of the head without contrast fails to reveal any acute intracranial pathology.   CT scan of the abdomen and pelvis without contrast reveals multiple hypodense masses within the liver consistent with cysts. There was no acute abdominal or pelvic pathology seen.   Flat and erect abdominal films cannot exclude a gallstone and there does appear to be aortoiliac vascular disease with calcification and a cardiac pacemaker.   Ultrasound of the gallbladder reveals gallstones in the neck of the gallbladder which are nonmobile, sludge in the gallbladder, gallbladder wall thickness that is normal, negative sonographic Murphy's sign, and a common bile duct of 5 mm.   ASSESSMENT: Nonmobile gallstone in a 81 year old white male with significant nausea, vomiting, anorexia, and constipation who takes Xarelto for his coronary artery disease and/or atrial fibrillation.   PLAN: The patient could undergo laparoscopic cholecystectomy in 48 hours after being off Xarelto if cleared for surgery by Internal Medicine. We will continue to follow with you during this time.   ____________________________ Claude Manges, MD wfm:drc D: 07/27/2012 02:58:56 ET T: 07/27/2012 11:03:20 ET JOB#: [id number] cc: Claude Manges, MD, <Dictator> Claude Manges MD ELECTRONICALLY SIGNED 07/28/2012 0:34

## 2015-04-15 NOTE — Consult Note (Signed)
General Aspect patient is a 81 year old male with history of coronary artery disease status post coronary artery bypass grafting in 1987 with a left internal mammary to the LAD, saphenous vein graft OM1 and PDA. He had a normal left ventricular function. In 2009 underwent placement of a Zion is stent in the obtuse marginal and circumflex. He also has history of complete heart block diagnosed in 2004 and has a dual-chamber permanent pacemaker in place. He has been seen as an outpatient with chronic stable angina. Do to his atrial fibrillation he has been anticoagulated with Xarelto. He has also been treated with Plavix secondary to his coronary artery disease and a drug-eluting stents. He is now admitted with nausea and vomiting or abdominal discomfort and is felt to have acute cholecystitis. He denies any chest pain or shortness of breath. His pacemaker is functioning normally. His atrial fibrillation is well controlled with regard to rate. Patient's abdominal pain has improved somewhat since admission   Physical Exam:   GEN no acute distress    HEENT PERRL, hearing intact to voice    NECK supple    RESP normal resp effort  clear BS    CARD Regular rate and rhythm  Murmur    Murmur Systolic    Systolic Murmur axilla    ABD positive tenderness  normal BS    LYMPH negative neck, negative axillae    EXTR negative cyanosis/clubbing, negative edema    SKIN normal to palpation    NEURO cranial nerves intact, motor/sensory function intact    PSYCH A+O to time, place, person   Review of Systems:   Subjective/Chief Complaint right upper quadrant abdominal discomfort    General: No Complaints    Skin: No Complaints    ENT: No Complaints    Eyes: No Complaints    Neck: No Complaints    Respiratory: No Complaints    Cardiovascular: No Complaints    Gastrointestinal: Nausea    Vascular: No Complaints    Musculoskeletal: No Complaints    Neurologic: No Complaints     Hematologic: No Complaints    Endocrine: No Complaints    Psychiatric: No Complaints    Review of Systems: All other systems were reviewed and found to be negative    Medications/Allergies Reviewed Medications/Allergies reviewed   Home Medications: Medication Instructions Status  clopidogrel 75 mg oral tablet 1 tab(s) orally once a day (in the morning) Stopped 01-05-12 Active  Toprol-XL 100 mg oral tablet, extended release 1 tab(s) orally once a day (in the morning) Active  isosorbide mononitrate 60 mg oral tablet, extended release 1 tab(s) orally once a day (in the morning) Active  amlodipine 5 mg oral tablet 1 tab(s) orally once a day (in the morning) Active  ramipril 10 mg oral capsule 1 cap(s) orally once a day (in the morning) Active  meloxicam 7.5 mg oral tablet 1 tab(s) orally once a day (in the morning) Active  cyclobenzaprine 10 mg oral tablet 1 tab(s) orally once a day (at bedtime) Active  lorazepam 1 mg oral tablet 1 tab(s) orally once a day (at bedtime) Active  atorvastatin 40 mg oral tablet 1 tab(s) orally once a day (at bedtime) Active  PriLOSEC OTC 20 mg oral delayed release tablet 1 tab(s) orally once a day (in the morning) Active  nitroglycerin 0.4 mg 1 tab(s) sublingual , As Needed Active  Xarelto 15 mg oral tablet 1 tab(s) orally once a day (in the evening) Active  Aricept 5 mg oral  tablet 1 tab(s) orally once a day (at bedtime) Active  Lexapro 5 mg oral tablet 1 tab(s) orally once a day Active   EKG:   Interpretation AV paced    No Known Allergies:     Impression 81 year old male with history of coronary artery disease status post coronary artery bypass grafting as well as PCI with drug-eluting stent as late as 2009 now admitted with acute cholecystitis with consideration for surgical intervention. Patient has been on Xarelto for history of atrial fibrillation for anticoagulation as well as Plavix for his coronary stenting. He is hemodynamically stable from a  cardiovascular standpoint. He will need to be off of Xarelto for 48 hours prior to surgery and Plavix for 7 days prior to surgery. This for reduced bleeding risk. Would recommend continuing with beta blocker throughout the perioperative period.    Plan 1. Continue with current medications with the exception of discontinuing Plavix and Xarelto 2. Continue aggressive treatment of his cholecystitis and agree with surgical consultation 3. Patient's pacemaker appears to be functioning normally. 4. Further recommendations depending on course   Electronic Signatures: Dalia Heading (MD)  (Signed 01-Aug-13 21:04)  Authored: General Aspect/Present Illness, History and Physical Exam, Review of System, Home Medications, EKG , Allergies, Impression/Plan   Last Updated: 01-Aug-13 21:04 by Dalia Heading (MD)

## 2015-04-15 NOTE — Discharge Summary (Signed)
PATIENT NAME:  Nathan Saunders, Nathan Saunders MR#:  [id number] DATE OF BIRTH:  [birth year]  DATE OF ADMISSION:  07/26/2012 DATE OF DISCHARGE:  08/07/2012  FINAL DIAGNOSES:  1. Nausea, vomiting, and anorexia. 2. Chronic cholecystitis and cholelithiasis.  3. Coronary artery disease.  4. Hypertension.  5. Dyslipidemia.  6. Benign prostatic hypertrophy.  7. Atrial fibrillation, on Xarelto and Plavix.   PRINCIPLE PROCEDURES:  1. Laparoscopic cholecystectomy, 07/31/2012.  2. Cardiology consultation featuring Dr. Gwen PoundsKowalski.  3. [facility]    HOSPITAL COURSE: The patient was admitted with nausea, vomiting, abdominal pain and nonmobile gallstone. His Xarelto was held for several days. Cardiology consultation was obtained through Dr. Lady GaryFath and associates. The patient was deemed suitable for operative intervention on 07/31/2012 at which point Dr. Michela PitcherEly performed laparoscopic cholecystectomy with cholangiography. Postoperatively the patient did well. His diet was able to be advanced, however, he did have a slight ileus which prolonged his hospital stay several days. He was restarted back on his Xarelto and Plavix. His diet was able to be advanced. He tolerated this well and was deemed suitable for transfer to a rehab facility on 08/07/2012.   DISCHARGE MEDICATIONS:  1. Dulcolax suppository 10 mg rectally as needed for constipation. 2. Aricept 5 mg by mouth once a day. 3. Protonix 40 mg by mouth once a day.  4. Potassium chloride extended-release 40 mEq by mouth twice a day. 5. Colace 100 mg by mouth twice a day. 6. Imdur 60 mg by mouth daily. 7. Amlodipine 5 mg by mouth once a day. 8. Plavix 75 mg by mouth once a day. 9. Toprol-XL 50 mg by mouth daily. 10. Tylenol extended-release 1000 mg by mouth every eight hours as needed for pain or fever.  11. Atorvastatin 40 mg by mouth daily. 12. Lexapro 5 mg by mouth daily. 13. Mobic 7.5 mg by mouth  daily. 14. Xarelto 50 mg by mouth once a day. 15. Ultram 50 mg by mouth every six hours as needed for pain.  DISCHARGE INSTRUCTIONS: Follow-up with Dr. Marlowe KaysEly's office in 3 to 4 weeks. Call with any questions or concerns.  ____________________________ Redge GainerMark A. Egbert GaribaldiBird, MD mab:slb D: 08/07/2012 10:15:29 ET T: 08/07/2012 10:30:04 ET JOB#: [id number]  cc: Loraine LericheMark A. Egbert GaribaldiBird, MD, <Dictator> Carmie Endalph L. Ely III, MD Duane LopeJeffrey D. Judithann SheenSparks, MD Darlin PriestlyKenneth A. Lady GaryFath, MD Raynald KempMARK A Xitlalli Newhard MD ELECTRONICALLY SIGNED 08/12/2012 17:35

## 2015-04-18 ENCOUNTER — Ambulatory Visit (INDEPENDENT_AMBULATORY_CARE_PROVIDER_SITE_OTHER): Payer: Commercial Managed Care - HMO | Admitting: Neurology

## 2015-04-18 ENCOUNTER — Encounter: Payer: Self-pay | Admitting: Neurology

## 2015-04-18 VITALS — BP 168/86 | HR 85 | Ht 70.0 in | Wt 153.6 lb

## 2015-04-18 DIAGNOSIS — R413 Other amnesia: Secondary | ICD-10-CM | POA: Diagnosis not present

## 2015-04-18 DIAGNOSIS — G309 Alzheimer's disease, unspecified: Secondary | ICD-10-CM | POA: Diagnosis not present

## 2015-04-18 DIAGNOSIS — F0280 Dementia in other diseases classified elsewhere without behavioral disturbance: Secondary | ICD-10-CM

## 2015-04-18 HISTORY — DX: Alzheimer's disease, unspecified: F02.80

## 2015-04-18 MED ORDER — DONEPEZIL HCL 5 MG PO TABS: 5.0000 mg | ORAL_TABLET | Freq: Every day | ORAL | Status: DC

## 2015-04-18 NOTE — Patient Instructions (Signed)
Alzheimer Disease Caregiver Guide Alzheimer disease is an illness that affects a person's brain. It causes a person to lose the ability to remember things and make good decisions. As the disease progresses, the person is unable to take care of himself or herself and needs more and more help to do simple tasks. Taking care of someone with Alzheimer disease can be very challenging and overwhelming.  MEMORY LOSS AND CONFUSION Memory loss and confusion is mild in the beginning stages of the disease. Both of these problems become more severe as the disease progresses. Eventually, the person will not recognize places or even close family members and friends.   Stay calm.  Respond with a short explanation. Long explanations can be overwhelming and confusing.  Avoid corrections that sound like scolding.  Try not to take it personally, even if the person forgets your name. BEHAVIOR CHANGES Behavior changes are part of the disease. The person may develop depression, anxiety, anger, hallucinations, or other behavior changes. These changes can come on suddenly and may be in response to pain, infection, changes in the environment (temperature, noise), overstimulation, or feeling lost or scared.   Try not to take behavior changes personally.  Remain calm and patient.  Do not argue or try to convince the person about a specific point. This will only make him or her more agitated.  Know that the behavior changes are part of the disease process and try to work through it. TIPS TO REDUCE FRUSTRATION  Schedule wisely by making appointments and doing daily tasks, like bathing and dressing, when the person is at his or her best.  Take your time. Simple tasks may take a lot longer, so be sure to allow for plenty of time.  Limit choices. Too many choices can be overwhelming and stressful for the person.  Involve the person in what you are doing.  Stick to a routine.  Avoid new or crowded situations, if  possible.  Use simple words, short sentences, and a calm voice. Only give one direction at a time.  Buy clothes and shoes that are easy to put on and take off.  Let people help if they offer. HOME SAFETY Keeping the home safe is very important to reduce the risk of falls and injuries.   Keep floors clear of clutter. Remove rugs, magazine racks, and floor lamps.  Keep hallways well lit.  Put a handrail and nonslip mat in the bathtub or shower.  Put childproof locks on cabinets with dangerous items, such as medicine, alcohol, guns, toxic cleaning items, sharp tools or utensils, matches, or lighters.  Place locks on doors where the person cannot easily see or reach them. This helps ensure that the person cannot wander out of the house and get lost.  Be prepared for emergencies. Keep a list of emergency phone numbers and addresses in a convenient area. PLANS FOR THE FUTURE  Do not put off talking about finances.  Talk about money management. People with Alzheimer disease have trouble managing their money as the disease gets worse.  Get help from professional advisors regarding financial and legal matters.  Do not put off talking about future care.  Choose a power of attorney. This is someone who can make decisions for the person with Alzheimer disease when he or she is no longer able to do so.  Talk about driving and when it is the right time to stop. The person's health care provider can help give advice on this matter.  Talk about   the person's living situation. If he or she lives alone, you need to make sure he or she is safe. Some people need extra help at home, and others need more care at a nursing home or care center. SUPPORT GROUPS Joining a support group can be very helpful for caregivers of people with Alzheimer disease. Some advantages to being part of a support group include:   Getting strategies to manage stress.  Sharing experiences with others.  Receiving  emotional comfort and support.  Learning new caregiving skills as the disease progresses.  Knowing what community resources are available and taking advantage of them. SEEK MEDICAL CARE IF:  The person has a fever.  The person has a sudden change in behavior that does not improve with calming strategies.  The person is unable to manage in his or her current living situation.  The person threatens you or anyone else, including himself or herself.  You are no longer able to care for the person. Document Released: 08/24/2004 Document Revised: 04/29/2014 Document Reviewed: 01/19/2012 Exit[employer] Patient Information 2015 [employer]. This information is not intended to replace advice given to you by your health care provider. Make sure you discuss any questions you have with your health care provider.  

## 2015-04-18 NOTE — Op Note (Signed)
PATIENT NAME:  Nathan Saunders, Denman B MR#:  [id number] DATE OF BIRTH:  [birth year]  DATE OF PROCEDURE:  08/06/2013  PREOPERATIVE DIAGNOSIS: Cataract, right eye.   POSTOPERATIVE DIAGNOSIS: Cataract, right eye.  PROCEDURE PERFORMED: Extracapsular cataract extraction using phacoemulsification with placement Alcon [id number], 23.5-diopter posterior chamber lens, serial number [id number].   SURGEON: Maylon PeppersSteven A. Ajahni Nay, M.D.   ANESTHESIA: 4% lidocaine and 0.75% Marcaine, a 50-50 mixture with 10 units/mL of HyoMax added given as a peribulbar.   ANESTHESIOLOGIST: Dr. Darleene CleaverVan Staveren.   COMPLICATIONS: None.   ESTIMATED BLOOD LOSS: Less than 1 mL.   DESCRIPTION OF PROCEDURE: The patient was brought to the operating room and given a peribulbar block.  The patient was then prepped and draped in the usual fashion.  The vertical rectus muscles were imbricated using 5-0 silk sutures.  These sutures were then clamped to the sterile drapes as bridle sutures.  A limbal peritomy was performed extending two clock hours and hemostasis was obtained with cautery.  A partial thickness scleral groove was made at the surgical limbus and dissected anteriorly in a lamellar dissection using an Alcon crescent knife.  The anterior chamber was entered superonasally with a Superblade and through the lamellar dissection with a 2.6 mm keratome.  DisCoVisc was used to replace the aqueous and a continuous tear capsulorrhexis was carried out.  Hydrodissection and hydrodelineation were carried out with balanced salt and a 27 gauge canula.  The nucleus was rotated to confirm the effectiveness of the hydrodissection.  Phacoemulsification was carried out using a divide-and-conquer technique.  Total ultrasound time was 1 minute and 58.6 seconds with an average power of 25.9%. CDE of 50.07.  Irrigation/aspiration was used to remove the residual cortex.  DisCoVisc was used to inflate the capsule and the internal incision was enlarged to 3 mm with  the crescent knife.  The intraocular lens was folded and inserted into the capsular bag using the AcrySert delivery system.  Irrigation/aspiration was used to remove the residual DisCoVisc.  Miostat was injected into the anterior chamber through the paracentesis track to inflate the anterior chamber and induce miosis.  The wound was checked for leaks and none were found. The conjunctiva was closed with cautery and the bridle sutures were removed.  Two drops of 0.3% Vigamox were placed on the eye.   An eye shield was placed on the eye.  The patient was discharged to the recovery room in good condition.  ____________________________ Maylon PeppersSteven A. Zsazsa Bahena, MD sad:aw D: 08/06/2013 14:03:00 ET T: 08/06/2013 14:52:50 ET JOB#: [id number]  cc: Viviann SpareSteven A. Lithzy Bernard, MD, <Dictator> Erline LevineSTEVEN A Cristella Stiver MD ELECTRONICALLY SIGNED 08/13/2013 13:52

## 2015-04-18 NOTE — Progress Notes (Signed)
Reason for visit: Memory disorder  Nathan Saunders is an 81 y.o. male  History of present illness:  Nathan Saunders is an 81 year old left-handed white male with a history of a progressive memory disorder consistent with  Alzheimer's disease. The patient had been on Aricept 10 mg at night. The patient was taken off of this medication in January after he fell at night, and struck his head. It is not clear why the Aricept was stopped. The patient had not been losing weight, he did not report diarrhea. There was no history of syncope. The patient has been placed on Namenda, currently at 28 mg extended-release capsule daily. The patient has an relatively stable with his cognitive functioning, he has not given up any activities of daily living secondary to memory. He sleeps well at night, he does not operate a motor vehicle. He lives with his son.  Past Medical History  Diagnosis Date  . Memory disturbance     progressive  . Hypertension   . Dyslipidemia   . CAD (coronary artery disease)   . Atrial fibrillation   . Benign enlargement of prostate   . Insomnia   . Cervical spondylosis   . Gait disturbance   . Cervicogenic headache   . Memory difficulty 09/24/2014  . Falls   . Alzheimer's disease 04/18/2015    Past Surgical History  Procedure Laterality Date  . Coronary artery bypass graft    . Transurethral resection of prostate    . Hernia repair Bilateral   . Coronary stent placement    . Pacemaker placement      Family History  Problem Relation Age of Onset  . Heart disease Mother   . Heart disease Father   . Cancer Brother   . Dementia Neg Hx   . Heart disease Brother     Social history:  reports that he has quit smoking. His smoking use included Cigarettes. He has a 15 pack-year smoking history. He has never used smokeless tobacco. He reports that he does not drink alcohol or use illicit drugs.    Allergies  Allergen Reactions  . Serotonin Reuptake Inhibitors (Ssris)     Other reaction(s): Other (See Comments) Nightmares    Medications:  Prior to Admission medications   Medication Sig Start Date End Date Taking? Authorizing Provider  amLODipine (NORVASC) 2.5 MG tablet Take 2.5 mg by mouth daily.   Yes Historical Provider, MD  atorvastatin (LIPITOR) 40 MG tablet Take 40 mg by mouth daily.   Yes Historical Provider, MD  Difluprednate (DUREZOL) 0.05 % EMUL Place 0.05 each into both eyes daily. 02/07/14  Yes Historical Provider, MD  finasteride (PROSCAR) 5 MG tablet Take 5 mg by mouth daily. 09/18/14  Yes Historical Provider, MD  isosorbide mononitrate (IMDUR) 60 MG 24 hr tablet Take 60 mg by mouth daily. 09/24/14  Yes Historical Provider, MD  meloxicam (MOBIC) 7.5 MG tablet Take 7.5 mg by mouth daily. 06/14/14  Yes Historical Provider, MD  metoprolol succinate (TOPROL-XL) 100 MG 24 hr tablet Take 100 mg by mouth daily. 09/03/14  Yes Historical Provider, MD  NAMENDA XR 28 MG CP24 24 hr capsule Take 28 mg by mouth daily. 04/01/15  Yes Historical Provider, MD  nitroGLYCERIN (NITRODUR - DOSED IN MG/24 HR) 0.1 mg/hr patch Place 0.1 mg onto the skin daily as needed. 09/06/14  Yes Historical Provider, MD  pantoprazole (PROTONIX) 40 MG tablet Take 40 mg by mouth daily. 09/06/14  Yes Historical Provider, MD  ranolazine (RANEXA)  500 MG 12 hr tablet Take 500 mg by mouth daily. 09/06/14  Yes Historical Provider, MD    ROS:  Out of a complete 14 system review of symptoms, the patient complains only of the following symptoms, and all other reviewed systems are negative.  Shortness of breath Chest pain Memory loss  Blood pressure 168/86, pulse 85, height 5\' 10"  (1.778 m), weight 153 lb 9.6 oz (69.673 kg).  Physical Exam  General: The patient is alert and cooperative at the time of the examination.  Skin: No significant peripheral edema is noted.   Neurologic Exam  Mental status: The patient is alert and cooperative. The patient scored a 15/30 on the mini mental status  examination.   Cranial nerves: Facial symmetry is present. Speech is normal, no aphasia or dysarthria is noted. Extraocular movements are full. Visual fields are full.  Motor: The patient has good strength in all 4 extremities.  Sensory examination: Soft touch sensation is symmetric on the face, arms, and legs.  Coordination: The patient has good finger-nose-finger and heel-to-shin bilaterally. The patient has apraxia with the use of the lower extremities.  Gait and station: The patient has a normal gait. Tandem gait is normal. Romberg is negative. No drift is seen.  Reflexes: Deep tendon reflexes are symmetric.   Assessment/Plan:  1. Alzheimer's disease  The patient was taken off of Aricept for an unknown reason. The patient will be placed back on 5 mg daily which she tolerated for quite some time. There is no evidence of bradycardia on the medical examination. The patient was not having any problems with appetite or weight loss. The patient will follow-up in 6-8 months. He will continue the Namenda as well.  Nathan Saunders. Keith Arael Piccione MD 04/18/2015 9:09 AM  [facility] [address] [place], [place] [id number]  Phone [phone] Fax [phone]

## 2015-04-19 NOTE — H&P (Signed)
PATIENT NAME:  Nathan Saunders, Nathan Saunders MR#:  [id number] DATE OF BIRTH:  Jun 19, 1932  DATE OF ADMISSION:  09/04/2014   PRIMARY DOCTOR: Duane Lope. Judithann Sheen, MD  EMERGENCY ROOM PHYSICIAN: Charlestine Night. Scotty Court, MD  CHIEF COMPLAINT: Chest pain.   HISTORY OF PRESENT ILLNESS: An 81 year old male brought in by EMS for chest pain. He has a history of hypertension, coronary artery disease, with 8 stents placed, comes in because of chest pain that woke him up from sleep. The patient says that he has some heaviness in the chest on the left side and was going to the left arm, associated with some dizziness and diaphoresis. The patient took nitroglycerin at home, but without relief. The patient denies any cough or fever, and no relieving factors or aggravating factors for chest pain. The patient's chest pain resolved by the time the ER physician went to see the patient. The patient's initial troponins were negative at 0.05; the second one came back positive at 0.15. Dr. Gwen Pounds is his cardiologist, who recommended admission>> .   PAST MEDICAL HISTORY: The patient's past medical history is significant for a history of triple bypass surgery, history of coronary artery disease with 8 stents placed, history of hypertension, hyperlipidemia, benign prostatic hypertrophy, transurethral resection of the prostate, history of chronic anemia, insomnia, and dementia.   PAST SURGICAL HISTORY: Significant for bypass surgery, history of TURP, history of inguinal hernia repairs, and history of annuloplasty.   ALLERGIES: As I mentioned, environmental allergies.   SOCIAL HISTORY: Lives with the son. No history of smoking; quit a long time back. No drugs. No alcohol.   FAMILY HISTORY: Both parents had MI and died of MI.  MEDICATIONS:  1.  Atorvastatin 40 mg p.o. daily.  2.  Aricept 5 mg p.o. daily at bedtime.  3.  Mobic 7.5 mg daily.  4.  Metoprolol succinate, Toprol-XL, 100 mg daily.  5.  Nitroglycerin 0.4 mg every 5 minutes as  needed for chest pain.  6.  Omeprazole 20 mg p.o. daily.   REVIEW OF SYSTEMS:  CONSTITUTIONAL: No fever. No fatigue.  EYES: No blurred vision.  EARS, NOSE, AND THROAT: No tinnitus. No ear pain. No epistaxis. No difficulty swallowing.  RESPIRATORY: No cough. No wheezing.  CARDIOVASCULAR: Left-sided chest pain this morning; denies any orthopnea or palpitations or PND. No syncope.  GASTROINTESTINAL: No nausea. No vomiting. No diarrhea.  GENITOURINARY: No dysuria.  ENDOCRINE: No polyuria or polydipsia.  HEMATOLOGIC: Has chronic anemia.  INTEGUMENTARY: No skin rashes.  MUSCULOSKELETAL: No joint pains.  NEUROLOGIC: No numbness or weakness or dysarthria.  PSYCHIATRIC: Has history of dementia.   PHYSICAL EXAMINATION:  VITAL SIGNS: Temperature 98.4, heart rate 87, blood pressure 187/127, saturations are 98% on room air. The patient is alert, awake, oriented. Well-developed, well-nourished male not in distress. Does have dementia and some memory gaps. Unable to give a complete history,  and the history is obtained from the son.  HEAD: Normocephalic, atraumatic.  EYES: Pupils equal, reacting to light. No conjunctival pallor. No icterus.  NOSE: No nasal lesions. No drainage.  EARS: No drainage. No external lesions.  MOUTH: No lesions. No exudates.  NECK: Supple. No JVD. No carotid bruit. Normal range of motion.  RESPIRATORY: Good respiratory effort. Clear to auscultation. No wheeze. No rales.  CARDIOVASCULAR: The patient has no chest wall tenderness. Regular rate and rhythm. No murmurs. No gallops. Pulses are equal in carotid, femoral, and dorsalis pedis. No peripheral edema.  GASTROINTESTINAL: Abdomen is soft, nontender, nondistended. Bowel sounds are present.  The patient has no rebound tenderness.  MUSCULOSKELETAL: Normal gait and station.  EXTREMITIES: Moves x 4. No tenderness or effusion. SKIN: Inspection is normal. Well-hydrated. No diaphoresis. No. LYMPHATIC: No lymphadenopathy in  cervical or axillary regions.  NEUROLOGIC: Alert, awake, oriented. Cranial nerves II through XII intact. Power 5/5 in upper and lower extremities. Sensory intact. DTRs 2+ bilaterally.  PSYCHIATRIC: Mood and affect are within normal limits.   LABORATORY DATA: Electrolytes: Sodium 137, potassium 3.6, chloride 101, bicarbonate 31, BUN 15, creatinine 1.03, glucose 113. LFTs within normal limits. Troponin initially 0.05. INR 0.9.   The patient's chest x-ray shows no pleural effusion, no pneumothorax. Has emphysematous changes. No acute abnormality. The second troponin is 0.15.   EKG shows electronic ventricular pacemaker at 86 beats per minute.   ASSESSMENT AND PLAN:  1.  The patient is an 81 year old male with coronary artery disease with multiple stents, who comes in with chest pain and elevated troponins. Symptoms concerning for non ST elevation myocardial infarction. The patient is going to be admitted to 2A. Continue him on aspirin, beta blockers, nitrites, Full dose Lovenox, statins. Obtain cardiology consult with Dr. Gwen PoundsKowalski. Continue to cycle troponins 2 more times.  2.  History of dementia. Continue his Aricept. 3.  Gastroesophageal reflux disease. Continue proton pump inhibitors.     TIME SPENT: About  60 minutes on history and physical. Discussed this with patient's son.    ____________________________ Katha HammingSnehalatha Roderick Calo, MD sk:MT D: 09/04/2014 13:52:43 ET T: 09/04/2014 14:22:08 ET JOB#: [id number]  cc: Katha HammingSnehalatha Kollins Fenter, MD, <Dictator> Katha HammingSNEHALATHA Holman Bonsignore MD ELECTRONICALLY SIGNED 09/24/2014 11:17

## 2015-04-19 NOTE — Op Note (Signed)
PATIENT NAME:  [name], Nathan B MR#:  [id number] DATE OF BIRTH:  [birth year]  DATE OF PROCEDURE:  03/04/2014  PREOPERATIVE DIAGNOSIS:  Cataract, left eye.    POSTOPERATIVE DIAGNOSIS:  Cataract, left eye.  PROCEDURE PERFORMED:  Extracapsular cataract extraction using phacoemulsification with placement of an Alcon [id number], 23.5-diopter posterior chamber lens, serial number [id number].  SURGEON:  Maylon PeppersSteven A. Tyresha Fede, MD  ASSISTANT:  None.  ANESTHESIA:  4% lidocaine and 0.75% Marcaine in a 50/50 mixture with Hylenex 10 units/mL added, given as a peribulbar.  ANESTHESIOLOGIST:  Dr. Darleene CleaverVan Staveren.  COMPLICATIONS:  None.  ESTIMATED BLOOD LOSS:  Less than 1 ml.  DESCRIPTION OF PROCEDURE:  The patient was brought to the operating room and given a peribulbar block.  The patient was then prepped and draped in the usual fashion.  The vertical rectus muscles were imbricated using 5-0 silk sutures.  These sutures were then clamped to the sterile drapes as bridle sutures.  A limbal peritomy was performed extending two clock hours and hemostasis was obtained with cautery.  A partial thickness scleral groove was made at the surgical limbus and dissected anteriorly in a lamellar dissection using an Alcon crescent knife.  The anterior chamber was entered supero-temporally with a Superblade and through the lamellar dissection with a 2.6 mm keratome.  DisCoVisc was used to replace the aqueous and a continuous tear capsulorrhexis was carried out.  Hydrodissection and hydrodelineation were carried out with balanced salt and a 27 gauge canula.  The nucleus was rotated to confirm the effectiveness of the hydrodissection.  Phacoemulsification was carried out using a divide-and-conquer technique.  Total ultrasound time was 1 minute and 17 seconds with an average power of 26.3 percent.  CDE 35.39..  Irrigation/aspiration was used to remove the residual cortex.  DisCoVisc was used to inflate the capsule and the  internal incision was enlarged to 3 mm with the crescent knife.  The intraocular lens was folded and inserted into the capsular bag using the AcrySert delivery system.  Irrigation/aspiration was used to remove the residual DisCoVisc.  Miostat was injected into the anterior chamber through the paracentesis track to inflate the anterior chamber and induce miosis.  Cefuroxime 0.1 mL containing 1 mg of drug was injected via the paracentesis track.  The wound was checked for leaks and none were found. No suture was added.  The conjunctiva was closed with cautery and the bridle sutures were removed.  Two drops of 0.3% Vigamox were placed on the eye.   An eye shield was placed on the eye.  The patient was discharged to the recovery room in good condition.  ____________________________ Maylon PeppersSteven A. Vinson Tietze, MD sad:cs D: 03/04/2014 13:46:01 ET T: 03/04/2014 14:06:15 ET JOB#: [id number]  cc: Viviann SpareSteven A. Julea Hutto, MD, <Dictator> Erline LevineSTEVEN A Timya Trimmer MD ELECTRONICALLY SIGNED 03/11/2014 13:52

## 2015-04-19 NOTE — Consult Note (Signed)
PATIENT NAME:  Nathan Saunders, Nathan Saunders MR#:  [id number] DATE OF BIRTH:  June 15, 1932  DATE OF CONSULTATION:  09/04/2014  REFERRING PHYSICIAN:  Susa GriffinsPadmaja Vasireddy, MD  CONSULTING PHYSICIAN:  Lamar BlinksBruce J. Anjel Perfetti, MD  REASON FOR CONSULTATION: Acute myocardial infarction, coronary artery disease, atrial fibrillation, hypertension, diabetes.   CHIEF COMPLAINT: "I had chest pain."   HISTORY OF PRESENT ILLNESS: This is an 81 year old male with known coronary artery disease, status post coronary artery bypass graft, multiple stents and grafts in the past who has had stable angina with appropriate long-acting nitrates and sublingual nitroglycerin on occasion. Additionally, the patient has had significant progression of chest pain, shortness of breath over the last week to the point where he could not move at all without chest pain. He awakened this morning with significant substernal chest discomfort lasting for several hours and was relieved by nitroglycerin. When seen in the Emergency Room, he had an EKG showing paced rhythm with an elevated troponin of 0.18 consistent with non-ST elevation myocardial infarction of likely bypass graft. The patient now is completely relieved of chest discomfort with nitroglycerin and heparin. He has had atrial fibrillation in the past with controlled ventricular rate on current medications, but cannot tolerate anticoagulation due to hematuria. Hypertension, hyperlipidemia and diabetes have been well controlled at this time and attributing to above issues as well. The patient has had an echocardiogram recently showing moderate LV systolic dysfunction with ejection fraction of 40% and inferior hypokinesis consistent with previous myocardial infarction.   REVIEW OF SYSTEMS: The remainder of review of systems negative for vision change, ringing in the ears, hearing loss, cough, congestion, heartburn, nausea, vomiting, diarrhea, bloody stools, stomach pain, extremity pain, leg weakness, cramping  of the buttocks, known blood clots, headaches, blackouts, dizzy spells, nosebleeds, congestion, trouble swallowing, frequent urination, urination at night, muscle weakness, numbness, anxiety, depression, skin lesions or skin rashes.   PAST MEDICAL HISTORY:  1.  Known coronary disease, status post previous stenting. 2.  Inferior infarct.  3.  Hypertension.  4.  Diabetes.  5.  Non-rheumatic chronic atrial fibrillation.  6.  Cardiomyopathy with LV systolic dysfunction and non-rheumatic valvular heart disease.   FAMILY HISTORY: No family members with early onset of cardiovascular disease or hypertension.   SOCIAL HISTORY: Per person, currently denies alcohol or tobacco use.   ALLERGIES: HE HAS ALLERGY TO MEDICATIONS AS LISTED.  PHYSICAL EXAMINATION:  VITAL SIGNS: Blood pressure is 110/68 bilaterally. Heart rate is 70 and irregular.  GENERAL: He is a well-appearing male in no acute distress.  HEENT: No icterus, thyromegaly, ulcers, hemorrhage or xanthelasma.  CARDIOVASCULAR: Irregular irregular with normal S1 and S2 with a 2/6 apical murmur consistent with mitral regurgitation. PMI is diffuse. Carotid upstroke normal without bruit. Jugular venous pressure is normal.  LUNGS: Have a few decreased breath sounds and a few expiratory wheezes.  ABDOMEN: Soft, nontender without hepatosplenomegaly or masses. Abdominal aorta is normal size without bruit.  EXTREMITIES: Shows 2+ radial, femoral, dorsal pedal pulses with no lower extremity edema, cyanosis, clubbing or ulcers.  NEUROLOGIC: He is oriented to time, place, and person, with normal mood and affect.   ASSESSMENT: An 81 year old male with acute non-ST elevation myocardial infarction with known coronary artery disease, status post coronary artery bypass graft and previous stents, left ventricular systolic dysfunction and mild cardiomyopathy without evidence of acute congestive heart failure, diabetes with complication of cardiovascular disease but  no evidence of chronic kidney disease, hypertension, hypertensive heart disease, non-rheumatic chronic atrial fibrillation needing further treatment options.  RECOMMENDATIONS:  1.  Continue serial ECG and enzymes to assess extent of myocardial infarction.  2.  Proceed to cardiac catheterization to assess coronary anatomy and further treatment thereof as necessary.  3.  Heparin for further risk reduction of myocardial infarction.  4.  Beta blocker for heart rate control as well as myocardial infarction.  5.  Nitrates for angina.  6.  Diabetes medication management with oral medications with a goal hemoglobin A1c below 7. 7.  Lipid management with atorvastatin, high-intensity cholesterol therapy. 8.  Further treatment options based on above.    ____________________________ Lamar Blinks, MD bjk:TT D: 09/04/2014 19:19:34 ET T: 09/04/2014 21:13:52 ET JOB#: [id number]  cc: Lamar Blinks, MD, <Dictator> Lamar Blinks MD ELECTRONICALLY SIGNED 09/25/2014 15:46

## 2015-04-20 NOTE — Op Note (Signed)
PATIENT NAME:  Nathan Saunders, Danyl B MR#:  [id number] DATE OF BIRTH:  [birth year]  DATE OF PROCEDURE:  01/10/2012  PRIMARY CARE PHYSICIAN:  Dr. Judithann SheenSparks.   PREPROCEDURE DIAGNOSES:  1. Complete heart block.  2. Elective replacement indication.   POSTPROCEDURE DIAGNOSIS: Atrial sensing with ventricular pacing.   PROCEDURE PERFORMED: Dual chamber pacemaker generator change-out.   SURGEON:  Marcina MillardAlexander Telesia Ates, MD.   INDICATION: The patient is a 81 year old male who is status post dual-chamber pacemaker for complete heart block 09/04. Recent pacemaker interrogation has shown the pacemaker generator was at elective replacement indication. Underlying rhythm is no ventricular escape. Procedure, risks, benefits and alternatives of pacemaker generator change-out were explained to the patient and informed written consent was obtained.   DESCRIPTION OF PROCEDURE: She was brought to the Operating Room in a fasting state. The left pectoral region was prepped and draped in the usual sterile manner. Anesthesia was obtained with 1% lidocaine locally. A 6 cm incision was performed over the left pectoral region. The old pacemaker generator was retrieved by electrocautery and blunt dissection. The leads were disconnected from the old pacemaker generator and interrogated. The leads were then connected to a new dual chamber rate responsive pacemaker generator (Medtronic Adapta [id number]). The pacemaker pocket was irrigated with gentamicin solution. The new pacemaker generator was positioned in the pocket. The pocket was closed with 2-0 and 4-0 Vicryl, respectively. Steri-Strips and pressure dressing were applied.   ____________________________ Marcina MillardAlexander Daleiza Bacchi, MD ap:ap D: 01/10/2012 13:20:23 ET            T: 01/10/2012 17:44:48 ET JOB#: [id number] cc: Marcina MillardAlexander Niquan Charnley, MD, <Dictator> Marcina MillardALEXANDER Malijah Lietz MD ELECTRONICALLY SIGNED 01/20/2012 12:37

## 2015-05-20 DIAGNOSIS — I442 Atrioventricular block, complete: Secondary | ICD-10-CM | POA: Diagnosis not present

## 2015-06-10 DIAGNOSIS — I209 Angina pectoris, unspecified: Secondary | ICD-10-CM | POA: Diagnosis not present

## 2015-06-10 DIAGNOSIS — I495 Sick sinus syndrome: Secondary | ICD-10-CM | POA: Diagnosis not present

## 2015-06-10 DIAGNOSIS — E782 Mixed hyperlipidemia: Secondary | ICD-10-CM | POA: Diagnosis not present

## 2015-06-10 DIAGNOSIS — I10 Essential (primary) hypertension: Secondary | ICD-10-CM | POA: Diagnosis not present

## 2015-06-10 DIAGNOSIS — I482 Chronic atrial fibrillation: Secondary | ICD-10-CM | POA: Diagnosis not present

## 2015-06-10 DIAGNOSIS — I25709 Atherosclerosis of coronary artery bypass graft(s), unspecified, with unspecified angina pectoris: Secondary | ICD-10-CM | POA: Diagnosis not present

## 2015-08-08 ENCOUNTER — Ambulatory Visit: Payer: Commercial Managed Care - HMO | Admitting: Neurology

## 2015-10-07 DIAGNOSIS — F0150 Vascular dementia without behavioral disturbance: Secondary | ICD-10-CM | POA: Diagnosis not present

## 2015-10-07 DIAGNOSIS — Z23 Encounter for immunization: Secondary | ICD-10-CM | POA: Diagnosis not present

## 2015-10-07 DIAGNOSIS — I10 Essential (primary) hypertension: Secondary | ICD-10-CM | POA: Diagnosis not present

## 2015-10-07 DIAGNOSIS — I482 Chronic atrial fibrillation: Secondary | ICD-10-CM | POA: Diagnosis not present

## 2015-10-07 DIAGNOSIS — E782 Mixed hyperlipidemia: Secondary | ICD-10-CM | POA: Diagnosis not present

## 2015-10-07 DIAGNOSIS — Z79899 Other long term (current) drug therapy: Secondary | ICD-10-CM | POA: Diagnosis not present

## 2015-10-07 DIAGNOSIS — Z125 Encounter for screening for malignant neoplasm of prostate: Secondary | ICD-10-CM | POA: Diagnosis not present

## 2015-10-14 ENCOUNTER — Telehealth: Payer: Self-pay

## 2015-10-14 NOTE — Telephone Encounter (Signed)
Called patient to offer earlier appt w/ NP-Megan Millikan. Son Aurther Lofterry said they have already made arrangements for future appt.

## 2015-10-24 ENCOUNTER — Ambulatory Visit (INDEPENDENT_AMBULATORY_CARE_PROVIDER_SITE_OTHER): Payer: Commercial Managed Care - HMO | Admitting: Neurology

## 2015-10-24 ENCOUNTER — Encounter: Payer: Self-pay | Admitting: Neurology

## 2015-10-24 VITALS — BP 151/74 | HR 81 | Ht 70.0 in | Wt 155.5 lb

## 2015-10-24 DIAGNOSIS — G301 Alzheimer's disease with late onset: Secondary | ICD-10-CM

## 2015-10-24 DIAGNOSIS — R413 Other amnesia: Secondary | ICD-10-CM

## 2015-10-24 DIAGNOSIS — F0280 Dementia in other diseases classified elsewhere without behavioral disturbance: Secondary | ICD-10-CM

## 2015-10-24 MED ORDER — DONEPEZIL HCL 5 MG PO TABS: 5.0000 mg | ORAL_TABLET | Freq: Every day | ORAL | Status: DC

## 2015-10-24 NOTE — Progress Notes (Signed)
Reason for visit: Memory disturbance  Nathan Saunders is an 81 y.o. male  History of present illness:  Nathan Saunders is an 81 year old left-handed white male with a history of a progressive memory disturbance. The patient lives with his son, he is no longer operating motor vehicle. The patient is requiring assistance with keeping up with medications, appointments, and with the finances. The patient is able to otherwise perform his own activities of daily living. The patient is sleeping well at night, no agitation or hallucinations have been noted. The son believes that his cognitive functioning level has been maintained since last seen. The patient is on Namenda and low-dose Aricept. He is having some issues with indigestion, reflux problems. He is on Protonix. The patient returns to this office for an evaluation.  Past Medical History  Diagnosis Date  . Memory disturbance     progressive  . Hypertension   . Dyslipidemia   . CAD (coronary artery disease)   . Atrial fibrillation (HCC)   . Benign enlargement of prostate   . Insomnia   . Cervical spondylosis   . Gait disturbance   . Cervicogenic headache   . Memory difficulty 09/24/2014  . Falls   . Alzheimer's disease 04/18/2015    Past Surgical History  Procedure Laterality Date  . Coronary artery bypass graft    . Transurethral resection of prostate    . Hernia repair Bilateral   . Coronary stent placement    . Pacemaker placement      Family History  Problem Relation Age of Onset  . Heart disease Mother   . Heart disease Father   . Cancer Brother   . Dementia Neg Hx   . Heart disease Brother     Social history:  reports that he has quit smoking. His smoking use included Cigarettes. He has a 15 pack-year smoking history. He has never used smokeless tobacco. He reports that he does not drink alcohol or use illicit drugs.    Allergies  Allergen Reactions  . Seroquel [Quetiapine Fumarate]     Nightmares  . Serotonin  Reuptake Inhibitors (Ssris)     Other reaction(s): Other (See Comments) Nightmares    Medications:  Prior to Admission medications   Medication Sig Start Date End Date Taking? Authorizing Provider  amLODipine (NORVASC) 2.5 MG tablet Take 2.5 mg by mouth daily.   Yes Historical Provider, MD  aspirin 81 MG tablet Take 81 mg by mouth daily.   Yes Historical Provider, MD  atorvastatin (LIPITOR) 40 MG tablet Take 40 mg by mouth daily.   Yes Historical Provider, MD  Difluprednate (DUREZOL) 0.05 % EMUL Place 0.05 each into both eyes daily. 02/07/14  Yes Historical Provider, MD  donepezil (ARICEPT) 5 MG tablet Take 1 tablet (5 mg total) by mouth at bedtime. 04/18/15  Yes York Spaniel, MD  finasteride (PROSCAR) 5 MG tablet Take 5 mg by mouth daily. 09/18/14  Yes Historical Provider, MD  isosorbide mononitrate (IMDUR) 60 MG 24 hr tablet Take 60 mg by mouth daily. 09/24/14  Yes Historical Provider, MD  meloxicam (MOBIC) 7.5 MG tablet Take 7.5 mg by mouth daily. 06/14/14  Yes Historical Provider, MD  metoprolol succinate (TOPROL-XL) 100 MG 24 hr tablet Take 100 mg by mouth daily. 09/03/14  Yes Historical Provider, MD  NAMENDA XR 28 MG CP24 24 hr capsule Take 28 mg by mouth daily. 04/01/15  Yes Historical Provider, MD  nitroGLYCERIN (NITRODUR - DOSED IN MG/24 HR) 0.1 mg/hr  patch Place 0.1 mg onto the skin daily as needed. 09/06/14  Yes Historical Provider, MD  pantoprazole (PROTONIX) 40 MG tablet Take 40 mg by mouth daily. 09/06/14  Yes Historical Provider, MD    ROS:  Out of a complete 14 system review of symptoms, the patient complains only of the following symptoms, and all other reviewed systems are negative.  Chest tightness, chest pain Memory loss Confusion  Blood pressure 151/74, pulse 81, height 5\' 10"  (1.778 m), weight 155 lb 8 oz (70.534 kg).  Physical Exam  General: The patient is alert and cooperative at the time of the examination.  Skin: No significant peripheral edema is  noted.   Neurologic Exam  Mental status: The patient is alert and oriented x 3 at the time of the examination. The Mini-Mental Status Examination done today shows a total score of 11/30. The patient is able to name 3 animals in 30 seconds.   Cranial nerves: Facial symmetry is present. Speech is normal, no aphasia or dysarthria is noted. Extraocular movements are full. Visual fields are full.  Motor: The patient has good strength in all 4 extremities.  Sensory examination: Soft touch sensation is symmetric on the face, arms, and legs.  Coordination: The patient has good finger-nose-finger bilaterally, but the patient has severe apraxia with performing heel-to-shin bilaterally.  Gait and station: The patient has a normal gait. Tandem gait is normal. Romberg is negative. No drift is seen.  Reflexes: Deep tendon reflexes are symmetric.   Assessment/Plan:  1. Progressive memory disturbance, Alzheimer's disease  The patient will be maintained on Aricept and Namenda. He will follow-up in about 8 months. A prescription was given for the Aricept at this point.  Nathan Palau. Keith Willis MD 10/24/2015 2:48 PM  [facility] [address] [place], [place] [id number]  Phone [phone] Fax [phone]

## 2015-11-25 DIAGNOSIS — I442 Atrioventricular block, complete: Secondary | ICD-10-CM | POA: Diagnosis not present

## 2015-11-25 DIAGNOSIS — I209 Angina pectoris, unspecified: Secondary | ICD-10-CM | POA: Diagnosis not present

## 2015-11-25 DIAGNOSIS — I482 Chronic atrial fibrillation: Secondary | ICD-10-CM | POA: Diagnosis not present

## 2015-11-25 DIAGNOSIS — I25708 Atherosclerosis of coronary artery bypass graft(s), unspecified, with other forms of angina pectoris: Secondary | ICD-10-CM | POA: Diagnosis not present

## 2015-11-27 HISTORY — PX: NM MYOVIEW LTD: HXRAD82

## 2015-11-27 HISTORY — PX: TRANSTHORACIC ECHOCARDIOGRAM: SHX275

## 2015-12-10 DIAGNOSIS — I2510 Atherosclerotic heart disease of native coronary artery without angina pectoris: Secondary | ICD-10-CM | POA: Diagnosis not present

## 2015-12-10 DIAGNOSIS — I10 Essential (primary) hypertension: Secondary | ICD-10-CM | POA: Diagnosis not present

## 2015-12-10 DIAGNOSIS — I482 Chronic atrial fibrillation: Secondary | ICD-10-CM | POA: Diagnosis not present

## 2015-12-10 DIAGNOSIS — I340 Nonrheumatic mitral (valve) insufficiency: Secondary | ICD-10-CM | POA: Diagnosis not present

## 2015-12-10 DIAGNOSIS — I25708 Atherosclerosis of coronary artery bypass graft(s), unspecified, with other forms of angina pectoris: Secondary | ICD-10-CM | POA: Diagnosis not present

## 2015-12-10 DIAGNOSIS — I209 Angina pectoris, unspecified: Secondary | ICD-10-CM | POA: Diagnosis not present

## 2016-01-25 ENCOUNTER — Emergency Department: Payer: Commercial Managed Care - HMO

## 2016-01-25 ENCOUNTER — Encounter (HOSPITAL_COMMUNITY): Payer: Self-pay | Admitting: *Deleted

## 2016-01-25 ENCOUNTER — Inpatient Hospital Stay (HOSPITAL_COMMUNITY)
Admission: EM | Admit: 2016-01-25 | Discharge: 2016-01-28 | DRG: 287 | Disposition: A | Discharge status: Home / Self Care | Payer: Commercial Managed Care - HMO | Attending: Internal Medicine | Admitting: Internal Medicine

## 2016-01-25 ENCOUNTER — Emergency Department
Admission: EM | Admit: 2016-01-25 | Discharge: 2016-01-25 | Disposition: A | Discharge status: Other Acute Inpatient Hospital | Payer: Commercial Managed Care - HMO | Attending: Emergency Medicine | Admitting: Emergency Medicine

## 2016-01-25 ENCOUNTER — Encounter: Payer: Self-pay | Admitting: Intensive Care

## 2016-01-25 DIAGNOSIS — I10 Essential (primary) hypertension: Secondary | ICD-10-CM | POA: Diagnosis present

## 2016-01-25 DIAGNOSIS — Z955 Presence of coronary angioplasty implant and graft: Secondary | ICD-10-CM

## 2016-01-25 DIAGNOSIS — E876 Hypokalemia: Secondary | ICD-10-CM | POA: Diagnosis present

## 2016-01-25 DIAGNOSIS — G309 Alzheimer's disease, unspecified: Secondary | ICD-10-CM | POA: Diagnosis present

## 2016-01-25 DIAGNOSIS — E785 Hyperlipidemia, unspecified: Secondary | ICD-10-CM | POA: Diagnosis present

## 2016-01-25 DIAGNOSIS — I25710 Atherosclerosis of autologous vein coronary artery bypass graft(s) with unstable angina pectoris: Secondary | ICD-10-CM | POA: Diagnosis not present

## 2016-01-25 DIAGNOSIS — Z7982 Long term (current) use of aspirin: Secondary | ICD-10-CM | POA: Diagnosis not present

## 2016-01-25 DIAGNOSIS — Z791 Long term (current) use of non-steroidal anti-inflammatories (NSAID): Secondary | ICD-10-CM

## 2016-01-25 DIAGNOSIS — Z950 Presence of cardiac pacemaker: Secondary | ICD-10-CM | POA: Diagnosis not present

## 2016-01-25 DIAGNOSIS — I482 Chronic atrial fibrillation: Secondary | ICD-10-CM | POA: Diagnosis not present

## 2016-01-25 DIAGNOSIS — F71 Moderate intellectual disabilities: Secondary | ICD-10-CM | POA: Diagnosis present

## 2016-01-25 DIAGNOSIS — I159 Secondary hypertension, unspecified: Secondary | ICD-10-CM | POA: Diagnosis not present

## 2016-01-25 DIAGNOSIS — G300 Alzheimer's disease with early onset: Secondary | ICD-10-CM | POA: Diagnosis not present

## 2016-01-25 DIAGNOSIS — I25700 Atherosclerosis of coronary artery bypass graft(s), unspecified, with unstable angina pectoris: Secondary | ICD-10-CM | POA: Diagnosis not present

## 2016-01-25 DIAGNOSIS — I25110 Atherosclerotic heart disease of native coronary artery with unstable angina pectoris: Secondary | ICD-10-CM | POA: Diagnosis present

## 2016-01-25 DIAGNOSIS — I249 Acute ischemic heart disease, unspecified: Secondary | ICD-10-CM | POA: Insufficient documentation

## 2016-01-25 DIAGNOSIS — J439 Emphysema, unspecified: Secondary | ICD-10-CM | POA: Diagnosis not present

## 2016-01-25 DIAGNOSIS — I160 Hypertensive urgency: Secondary | ICD-10-CM | POA: Diagnosis present

## 2016-01-25 DIAGNOSIS — I200 Unstable angina: Secondary | ICD-10-CM | POA: Diagnosis present

## 2016-01-25 DIAGNOSIS — R079 Chest pain, unspecified: Secondary | ICD-10-CM | POA: Diagnosis not present

## 2016-01-25 DIAGNOSIS — Z87891 Personal history of nicotine dependence: Secondary | ICD-10-CM | POA: Diagnosis not present

## 2016-01-25 DIAGNOSIS — R413 Other amnesia: Secondary | ICD-10-CM | POA: Diagnosis present

## 2016-01-25 DIAGNOSIS — F0280 Dementia in other diseases classified elsewhere without behavioral disturbance: Secondary | ICD-10-CM | POA: Diagnosis not present

## 2016-01-25 DIAGNOSIS — I255 Ischemic cardiomyopathy: Secondary | ICD-10-CM | POA: Diagnosis not present

## 2016-01-25 HISTORY — DX: Anemia in other chronic diseases classified elsewhere: D63.8

## 2016-01-25 HISTORY — DX: Presence of aortocoronary bypass graft: Z95.1

## 2016-01-25 HISTORY — DX: Personal history of other diseases of the circulatory system: Z86.79

## 2016-01-25 HISTORY — DX: Presence of automatic (implantable) cardiac defibrillator: Z95.810

## 2016-01-25 HISTORY — DX: Atherosclerotic heart disease of native coronary artery without angina pectoris: I25.10

## 2016-01-25 HISTORY — DX: Ischemic cardiomyopathy: I25.5

## 2016-01-25 HISTORY — DX: Atherosclerosis of autologous vein coronary artery bypass graft(s) with unspecified angina pectoris: I25.719

## 2016-01-25 HISTORY — DX: Chronic atrial fibrillation, unspecified: I48.20

## 2016-01-25 LAB — CBC
HCT: 37.6 % — ABNORMAL LOW (ref 39.0–52.0)
HCT: 38.7 % — ABNORMAL LOW (ref 40.0–52.0)
HEMOGLOBIN: 13.5 g/dL (ref 13.0–18.0)
HEMOGLOBIN: 13 g/dL (ref 13.0–17.0)
MCH: 31.9 pg (ref 26.0–34.0)
MCH: 32.3 pg (ref 26.0–34.0)
MCHC: 34.9 g/dL (ref 32.0–36.0)
MCHC: 34.6 g/dL (ref 30.0–36.0)
MCV: 91.4 fL (ref 80.0–100.0)
MCV: 93.5 fL (ref 78.0–100.0)
Platelets: 137 10*3/uL — ABNORMAL LOW (ref 150–400)
Platelets: 138 10*3/uL — ABNORMAL LOW (ref 150–440)
RBC: 4.02 MIL/uL — ABNORMAL LOW (ref 4.22–5.81)
RBC: 4.23 MIL/uL — ABNORMAL LOW (ref 4.40–5.90)
RDW: 12.7 % (ref 11.5–15.5)
RDW: 13.5 % (ref 11.5–14.5)
WBC: 5.1 10*3/uL (ref 3.8–10.6)
WBC: 5.4 10*3/uL (ref 4.0–10.5)

## 2016-01-25 LAB — TROPONIN I: Troponin I: 0.04 ng/mL — ABNORMAL HIGH (ref <0.031)

## 2016-01-25 LAB — PROTIME-INR
INR: 0.97
PROTHROMBIN TIME: 13.1 s (ref 11.4–15.0)

## 2016-01-25 LAB — COMPREHENSIVE METABOLIC PANEL
ALK PHOS: 90 U/L (ref 38–126)
ALT: 17 U/L (ref 17–63)
AST: 21 U/L (ref 15–41)
Albumin: 3.9 g/dL (ref 3.5–5.0)
Anion gap: 6 (ref 5–15)
BUN: 16 mg/dL (ref 6–20)
CALCIUM: 9.4 mg/dL (ref 8.9–10.3)
CO2: 28 mmol/L (ref 22–32)
CREATININE: 0.97 mg/dL (ref 0.61–1.24)
Chloride: 109 mmol/L (ref 101–111)
Glucose, Bld: 103 mg/dL — ABNORMAL HIGH (ref 65–99)
Potassium: 3.4 mmol/L — ABNORMAL LOW (ref 3.5–5.1)
Sodium: 143 mmol/L (ref 135–145)
Total Bilirubin: 1.6 mg/dL — ABNORMAL HIGH (ref 0.3–1.2)
Total Protein: 6.7 g/dL (ref 6.5–8.1)

## 2016-01-25 LAB — BRAIN NATRIURETIC PEPTIDE: B NATRIURETIC PEPTIDE 5: 89 pg/mL (ref 0.0–100.0)

## 2016-01-25 LAB — I-STAT TROPONIN, ED: Troponin i, poc: 0.06 ng/mL (ref 0.00–0.08)

## 2016-01-25 LAB — APTT: aPTT: 30 seconds (ref 24–36)

## 2016-01-25 MED ORDER — PANTOPRAZOLE SODIUM 40 MG PO TBEC
40.0000 mg | DELAYED_RELEASE_TABLET | Freq: Every day | ORAL | Status: DC
  Administered 2016-01-26 – 2016-01-28 (×3): 40 mg via ORAL
  Filled 2016-01-25 (×4): qty 1

## 2016-01-25 MED ORDER — HYDROCODONE-ACETAMINOPHEN 5-325 MG PO TABS: 1.0000 | ORAL_TABLET | ORAL | Status: DC | PRN

## 2016-01-25 MED ORDER — ONDANSETRON HCL 4 MG PO TABS: 4.0000 mg | ORAL_TABLET | Freq: Four times a day (QID) | ORAL | Status: DC | PRN

## 2016-01-25 MED ORDER — HEPARIN BOLUS VIA INFUSION
4000.0000 [IU] | Freq: Once | INTRAVENOUS | Status: DC
  Filled 2016-01-25: qty 4000

## 2016-01-25 MED ORDER — NITROGLYCERIN IN D5W 200-5 MCG/ML-% IV SOLN
0.0000 ug/min | INTRAVENOUS | Status: DC
  Administered 2016-01-25: 5 ug/min via INTRAVENOUS

## 2016-01-25 MED ORDER — PANTOPRAZOLE SODIUM 40 MG IV SOLR
40.0000 mg | Freq: Once | INTRAVENOUS | Status: AC
  Administered 2016-01-25: 40 mg via INTRAVENOUS
  Filled 2016-01-25: qty 40

## 2016-01-25 MED ORDER — ASPIRIN EC 81 MG PO TBEC
81.0000 mg | DELAYED_RELEASE_TABLET | Freq: Every day | ORAL | Status: DC
  Administered 2016-01-26 – 2016-01-28 (×3): 81 mg via ORAL
  Filled 2016-01-25 (×3): qty 1

## 2016-01-25 MED ORDER — HEPARIN (PORCINE) IN NACL 100-0.45 UNIT/ML-% IJ SOLN
850.0000 [IU]/h | INTRAMUSCULAR | Status: DC
  Filled 2016-01-25: qty 250

## 2016-01-25 MED ORDER — ASPIRIN 81 MG PO CHEW
162.0000 mg | CHEWABLE_TABLET | ORAL | Status: AC
  Administered 2016-01-25: 162 mg via ORAL
  Filled 2016-01-25: qty 2

## 2016-01-25 MED ORDER — DONEPEZIL HCL 5 MG PO TABS
5.0000 mg | ORAL_TABLET | Freq: Every day | ORAL | Status: DC
  Administered 2016-01-25 – 2016-01-27 (×2): 5 mg via ORAL
  Filled 2016-01-25 (×4): qty 1

## 2016-01-25 MED ORDER — HYDRALAZINE HCL 20 MG/ML IJ SOLN
10.0000 mg | Freq: Four times a day (QID) | INTRAMUSCULAR | Status: DC | PRN
Start: 2016-01-25 — End: 2016-01-28
  Administered 2016-01-26: 10 mg via INTRAVENOUS
  Filled 2016-01-25: qty 1

## 2016-01-25 MED ORDER — HEPARIN SODIUM (PORCINE) 5000 UNIT/ML IJ SOLN
4000.0000 [IU] | Freq: Once | INTRAMUSCULAR | Status: AC
  Administered 2016-01-25: 4000 [IU] via INTRAVENOUS
  Filled 2016-01-25: qty 1

## 2016-01-25 MED ORDER — ATORVASTATIN CALCIUM 40 MG PO TABS
40.0000 mg | ORAL_TABLET | Freq: Every day | ORAL | Status: DC
  Administered 2016-01-26 – 2016-01-28 (×3): 40 mg via ORAL
  Filled 2016-01-25 (×3): qty 1

## 2016-01-25 MED ORDER — FINASTERIDE 5 MG PO TABS
5.0000 mg | ORAL_TABLET | Freq: Every day | ORAL | Status: DC
  Administered 2016-01-26 – 2016-01-28 (×3): 5 mg via ORAL
  Filled 2016-01-25 (×3): qty 1

## 2016-01-25 MED ORDER — ONDANSETRON HCL 4 MG/2ML IJ SOLN: 4.0000 mg | Freq: Four times a day (QID) | INTRAMUSCULAR | Status: DC | PRN

## 2016-01-25 MED ORDER — NITROGLYCERIN IN D5W 200-5 MCG/ML-% IV SOLN
INTRAVENOUS | Status: AC
  Filled 2016-01-25: qty 250

## 2016-01-25 MED ORDER — NITROGLYCERIN 2 % TD OINT
0.5000 [in_us] | TOPICAL_OINTMENT | TRANSDERMAL | Status: DC
  Filled 2016-01-25: qty 1

## 2016-01-25 MED ORDER — MEMANTINE HCL ER 28 MG PO CP24
28.0000 mg | ORAL_CAPSULE | Freq: Every day | ORAL | Status: DC
  Administered 2016-01-26 – 2016-01-28 (×3): 28 mg via ORAL
  Filled 2016-01-25 (×3): qty 1

## 2016-01-25 MED ORDER — HEPARIN SODIUM (PORCINE) 5000 UNIT/ML IJ SOLN
5000.0000 [IU] | Freq: Three times a day (TID) | INTRAMUSCULAR | Status: DC
  Administered 2016-01-25 – 2016-01-28 (×6): 5000 [IU] via SUBCUTANEOUS
  Filled 2016-01-25 (×6): qty 1

## 2016-01-25 MED ORDER — METOPROLOL SUCCINATE ER 100 MG PO TB24
100.0000 mg | ORAL_TABLET | Freq: Every day | ORAL | Status: DC
  Administered 2016-01-26 – 2016-01-28 (×3): 100 mg via ORAL
  Filled 2016-01-25 (×4): qty 1

## 2016-01-25 MED ORDER — AMLODIPINE BESYLATE 2.5 MG PO TABS
2.5000 mg | ORAL_TABLET | Freq: Every day | ORAL | Status: DC
  Administered 2016-01-26: 2.5 mg via ORAL
  Filled 2016-01-25 (×2): qty 1

## 2016-01-25 MED ORDER — ISOSORBIDE MONONITRATE ER 60 MG PO TB24
60.0000 mg | ORAL_TABLET | Freq: Every day | ORAL | Status: DC
Start: 2016-01-26 — End: 2016-01-28
  Administered 2016-01-26 – 2016-01-28 (×3): 60 mg via ORAL
  Filled 2016-01-25 (×3): qty 1

## 2016-01-25 MED ORDER — SORBITOL 70 % SOLN: 30.0000 mL | Freq: Every day | Status: DC | PRN

## 2016-01-25 NOTE — Consult Note (Signed)
ANTICOAGULATION CONSULT NOTE - Initial Consult  Pharmacy Consult for heparin Indication: chest pain/ACS  Allergies  Allergen Reactions  . Seroquel [Quetiapine Fumarate]     Nightmares  . Serotonin Reuptake Inhibitors (Ssris)     Other reaction(s): Other (See Comments) Nightmares    Patient Measurements: Height:  (180.3 cm) Weight: 155 lb (70.308 kg) IBW/kg (Calculated) : 75.3 Heparin Dosing Weight: 70.3kg  Vital Signs: Temp: 98.2 F (36.8 C) (01/29 1506) Temp Source: Oral (01/29 1506) BP: 176/65 mmHg (01/29 1506) Pulse Rate: 94 (01/29 1506)  Labs:  Recent Labs  01/25/16 1453  HGB 13.5  HCT 38.7*  PLT 138*    CrCl cannot be calculated (Patient has no serum creatinine result on file.).   Medical History: Past Medical History  Diagnosis Date  . Memory disturbance     progressive  . Hypertension   . Dyslipidemia   . CAD (coronary artery disease)   . Atrial fibrillation (HCC)   . Benign enlargement of prostate   . Insomnia   . Cervical spondylosis   . Gait disturbance   . Cervicogenic headache   . Memory difficulty 09/24/2014  . Falls   . Alzheimer's disease 04/18/2015    Medications:  Scheduled:  . heparin  4,000 Units Intravenous Once  . nitroGLYCERIN  0.5 inch Topical STAT    Assessment: Pt is a 82 year old male who presents with chest pain. Upon review of home meds, it does not appear patient was on anticoagulants prior to admission. Pharmacy is consulted to dose a heparin drip. Baseline labs were ordered.  Goal of Therapy:  Heparin level 0.3-0.7 units/ml Monitor platelets by anticoagulation protocol: Yes   Plan:  Give 4000 units bolus x 1 Start heparin infusion at 850 units/hr Check anti-Xa level in 8 hours and daily while on heparin Continue to monitor H&H and platelets  Thank you for the consult.  Olene Floss, Pharm.D Clinical Pharmacist 01/25/2016,4:57 PM

## 2016-01-25 NOTE — ED Notes (Signed)
Patient arrived by EMS from [facility]. Patient was at [facility] with son and started having chest pain. Patient took two of his nitro with no relief so callled EMS. EMS gave patient 1 nitro and 324 apsirin. Upon arrival patient denies chest pain. Patient has HX angina

## 2016-01-25 NOTE — ED Provider Notes (Signed)
CSN: [id number]     Arrival date & time 01/25/16  1735 History   First MD Initiated Contact with Patient 01/25/16 1743     Chief Complaint  Patient presents with  . Chest Pain     (Consider location/radiation/quality/duration/timing/severity/associated sxs/prior Treatment) HPI Comments: Patient presents as a transfer from [place] [facility]. He has a history of coronary artery disease status post bypass surgery and stent placement. He also has a dual-chamber pacemaker. He reported to [facility] after developing chest pain earlier today. He had a concerning EKG. Cardiology was consulted and recommended transfer to our facility for possible intervention. Patient still complains of some mild chest pain but he states it's much better than it was earlier. He has Nitropaste in place.  Patient is a 82 y.o. male presenting with chest pain.  Chest Pain Associated symptoms: no abdominal pain, no back pain, no cough, no diaphoresis, no dizziness, no fatigue, no fever, no headache, no nausea, no numbness, no shortness of breath, not vomiting and no weakness     Past Medical History  Diagnosis Date  . Hypertension   . Dyslipidemia   . Atherosclerotic heart disease 1987    Multivessel disease, referred for CABG  (Cardilogist - Dr. Gwen Pounds, [facility])  . S/P CABG x 3 1987    LIMA-LAD, SVG-OM 2, SVG RCA  . Coronary artery disease involving autologous vein coronary bypass graft with angina pectoris (HCC) 2001, 2002, 2003; 2006    [facility]: a. PCI to [facility] and SVG OM and OM in Jan '01; PTCA of OM 2 ISR in Aug '02; recurrent ISR in OM 2 - PCI with Cypher DES in Oct '03; d. SVG-OM occluded - PCI to mCx & OM2 with Promus 3.0 mm x 8 mm stents; 12/'16 -Cardiolite - Negative  . Ischemic cardiomyopathy     (EF 55% by Cardiolite in 11/2015) - but Echo in 11/2015: EF ~40% with apical akinesis and global hypokinesis, moderate LA dilation, mild RA dilation  . Chronic atrial fibrillation (HCC)   . History  of complete heart block September 2004    Dual-chamber put pacemaker placed [facility])  . Cervicogenic headache   . Falls   . Alzheimer's disease 04/18/2015  . Memory disturbance 09/24/2014    progressive; vascular dementia  . Cervicogenic headache   . Cervical spondylosis   . Gait disturbance   . Benign enlargement of prostate   . Insomnia   . Anemia of chronic disease    Past Surgical History  Procedure Laterality Date  . Coronary artery bypass graft      [facility]: LIMA-LAD, SVG OM and OM 2, SVG-PDA  . Transurethral resection of prostate    . Hernia repair Bilateral   . Coronary stent placement  Jan 2001, Aug 2002, Oct 2003; 8/06    a. 1/01 -BMS to SVG-RCA and SVG-OM 2; b. ISR in SVG-OM2 PTCA; c. Re-ISR SVG-OM2 --> Cypher DES;; d. SVG-OM occluded - PCI to mCx & OM2 with Promus 3.0 mm x 8 mm stents  . Pacemaker placement  September 2004    Dual-chamber, ([facility]/ [place]) - Dr. Darrold Junker  . Cholecystectomy  2013   . Nm myoview ltd  December 2016    [facility]: Negative Cardiolite, normal wall motion =- EF ~55%  . Transthoracic echocardiogram  December 2016     [facility]: EF ~40% with apical akinesis and global hypokinesis, moderate LA dilation, mild RA dilation   Family History  Problem Relation Age of Onset  . Dementia Neg Hx   .  Heart disease Brother   . Heart attack Mother   . Heart attack Father   . Lung cancer Brother   . Lung cancer Sister    Social History  Substance Use Topics  . Smoking status: Former Smoker -- 0.50 packs/day for 30 years    Types: Cigarettes  . Smokeless tobacco: Never Used  . Alcohol Use: No    Review of Systems  Constitutional: Negative for fever, chills, diaphoresis and fatigue.  HENT: Negative for congestion, rhinorrhea and sneezing.   Eyes: Negative.   Respiratory: Negative for cough, chest tightness and shortness of breath.   Cardiovascular: Positive for chest pain. Negative for leg swelling.  Gastrointestinal: Negative for nausea,  vomiting, abdominal pain, diarrhea and blood in stool.  Genitourinary: Negative for frequency, hematuria, flank pain and difficulty urinating.  Musculoskeletal: Negative for back pain and arthralgias.  Skin: Negative for rash.  Neurological: Negative for dizziness, speech difficulty, weakness, numbness and headaches.      Allergies  Seroquel and Serotonin reuptake inhibitors (ssris)  Home Medications   Prior to Admission medications   Medication Sig Start Date End Date Taking? Authorizing Provider  amLODipine (NORVASC) 2.5 MG tablet Take 2.5 mg by mouth daily.   Yes Historical Provider, MD  aspirin 81 MG tablet Take 81 mg by mouth daily.   Yes Historical Provider, MD  atorvastatin (LIPITOR) 40 MG tablet Take 40 mg by mouth daily.   Yes Historical Provider, MD  donepezil (ARICEPT) 5 MG tablet Take 1 tablet (5 mg total) by mouth at bedtime. 10/24/15  Yes York Spaniel, MD  finasteride (PROSCAR) 5 MG tablet Take 5 mg by mouth daily. 09/18/14  Yes Historical Provider, MD  isosorbide mononitrate (IMDUR) 60 MG 24 hr tablet Take 60 mg by mouth daily. 09/24/14  Yes Historical Provider, MD  meloxicam (MOBIC) 7.5 MG tablet Take 7.5 mg by mouth daily. 06/14/14  Yes Historical Provider, MD  metoprolol succinate (TOPROL-XL) 100 MG 24 hr tablet Take 100 mg by mouth daily. 09/03/14  Yes Historical Provider, MD  NAMENDA XR 28 MG CP24 24 hr capsule Take 28 mg by mouth daily. 04/01/15  Yes Historical Provider, MD  nitroGLYCERIN (NITRODUR - DOSED IN MG/24 HR) 0.1 mg/hr patch Place 0.1 mg onto the skin daily as needed (for angina).  09/06/14  Yes Historical Provider, MD  pantoprazole (PROTONIX) 40 MG tablet Take 40 mg by mouth daily. 09/06/14  Yes Historical Provider, MD   BP 173/61 mmHg  Pulse 61  Resp 16  SpO2 96% Physical Exam  Constitutional: He is oriented to person, place, and time. He appears well-developed and well-nourished.  HENT:  Head: Normocephalic and atraumatic.  Eyes: Pupils are equal,  round, and reactive to light.  Neck: Normal range of motion. Neck supple.  Cardiovascular: Normal rate, regular rhythm and normal heart sounds.   Pulmonary/Chest: Effort normal and breath sounds normal. No respiratory distress. He has no wheezes. He has no rales. He exhibits no tenderness.  Abdominal: Soft. Bowel sounds are normal. There is no tenderness. There is no rebound and no guarding.  Musculoskeletal: Normal range of motion. He exhibits no edema.  Lymphadenopathy:    He has no cervical adenopathy.  Neurological: He is alert and oriented to person, place, and time.  Skin: Skin is warm and dry. No rash noted.  Psychiatric: He has a normal mood and affect.    ED Course  Procedures (including critical care time) Labs Review Labs Reviewed  Rosezena Sensor, ED  Imaging Review Dg Chest 2 View  01/25/2016  CLINICAL DATA:  Chest pain EXAM: CHEST  2 VIEW COMPARISON:  09/04/2014 FINDINGS: Prior CABG. Dual lead pacer in place. Atherosclerotic aortic arch. Severe emphysema. Biapical pleural parenchymal scarring. Symmetric nipple shadows noted. Coronary atherosclerosis. Bony demineralization. No pleural effusion or airspace opacity. IMPRESSION: 1. Severe emphysema. 2. Prior CABG. Heart size currently normal; no edema or pleural effusion. Electronically Signed   By: Gaylyn Rong M.D.   On: 01/25/2016 15:32   I have personally reviewed and evaluated these images and lab results as part of my medical decision-making.   EKG Interpretation   Date/Time:  Sunday January 25 2016 17:51:06 EST Ventricular Rate:  68 PR Interval:  225 QRS Duration: 179 QT Interval:  469 QTC Calculation: 499 R Axis:   -79 Text Interpretation:  Atrial-ventricular dual-paced rhythm No further  analysis attempted due to paced rhythm similar to EKG from same day  Confirmed by Aster Screws  MD, Obrian Bulson ([id number]) on 01/25/2016 6:26:51 PM      MDM   Final diagnoses:  Unstable angina (HCC)    Dr. Herbie Baltimore saw the  patient on arrival to the ED. He reports that patient will not go to the Cath Lab and the patient will be admitted by the cardiology service.    Rolan Bucco, MD 01/25/16 [phone]

## 2016-01-25 NOTE — ED Notes (Signed)
MD at bedside. 

## 2016-01-25 NOTE — ED Provider Notes (Signed)
[facility] Emergency Department Provider Note   ____________________________________________  Time seen: Approximately 3:57 PM - patient was seen by me as Dr. Shaune Pollack was with a different critical patient who had just arrived.  I have reviewed the triage vital signs and the nursing notes.   HISTORY  Chief Complaint Chest Pain    HPI Nathan Saunders is a 82 y.o. male had a sudden onset of severe chest pain while walking at [facility] today. He reports it was severe 10 out of 10 in intensity and felt as though he is going to die. The very heavy pain across the chest which was hard to describe. She does not associate any abdominal pain nausea or vomiting. He took 2 nitroglycerin tablets and his pain recently ventrally thereafter.  Presently reports just a very minor hard to describe ache in the chest but reports is "much much better".   Past Medical History  Diagnosis Date  . Hypertension   . Dyslipidemia   . Atherosclerotic heart disease 1987    Multivessel disease, referred for CABG  (Cardilogist - Dr. Gwen Pounds, [facility])  . S/P CABG x 3 1987    LIMA-LAD, SVG-OM 2, SVG RCA  . Coronary artery disease involving autologous vein coronary bypass graft with angina pectoris (HCC) 2001, 2002, 2003; 2006    [facility]: a. PCI to [facility] and SVG OM and OM in Jan '01; PTCA of OM 2 ISR in Aug '02; recurrent ISR in OM 2 - PCI with Cypher DES in Oct '03; d. SVG-OM occluded - PCI to mCx & OM2 with Promus 3.0 mm x 8 mm stents; 12/'16 -Cardiolite - Negative  . Ischemic cardiomyopathy     (EF 55% by Cardiolite in 11/2015) - but Echo in 11/2015: EF ~40% with apical akinesis and global hypokinesis, moderate LA dilation, mild RA dilation  . Chronic atrial fibrillation (HCC)   . History of complete heart block September 2004    Dual-chamber put pacemaker placed [facility])  . Cervicogenic headache   . Falls   . Alzheimer's disease 04/18/2015  . Memory disturbance 09/24/2014     progressive; vascular dementia  . Cervicogenic headache   . Cervical spondylosis   . Gait disturbance   . Benign enlargement of prostate   . Insomnia   . Anemia of chronic disease   . AICD (automatic cardioverter/defibrillator) present     Patient Active Problem List   Diagnosis Date Noted  . Unstable angina (HCC) 01/25/2016  . Hypertension 01/25/2016  . MR (mental retardation), moderate 01/25/2016  . Hypertensive urgency 01/25/2016  . Coronary artery disease involving native heart with unstable angina pectoris (HCC) 01/25/2016  . Dyslipidemia 01/25/2016  . Alzheimer's disease 04/18/2015  . Memory difficulty 09/24/2014    Past Surgical History  Procedure Laterality Date  . Coronary artery bypass graft      [facility]: LIMA-LAD, SVG OM and OM 2, SVG-PDA  . Transurethral resection of prostate    . Hernia repair Bilateral   . Coronary stent placement  Jan 2001, Aug 2002, Oct 2003; 8/06    a. 1/01 -BMS to SVG-RCA and SVG-OM 2; b. ISR in SVG-OM2 PTCA; c. Re-ISR SVG-OM2 --> Cypher DES;; d. SVG-OM occluded - PCI to mCx & OM2 with Promus 3.0 mm x 8 mm stents  . Pacemaker placement  September 2004    Dual-chamber, ([facility]/ [place]) - Dr. Darrold Junker  . Cholecystectomy  2013   . Nm myoview ltd  December 2016    [facility]: Negative Cardiolite, normal  wall motion =- EF ~55%  . Transthoracic echocardiogram  December 2016     [facility]: EF ~40% with apical akinesis and global hypokinesis, moderate LA dilation, mild RA dilation    No current outpatient prescriptions on file.  Allergies Seroquel and Serotonin reuptake inhibitors (ssris)  Family History  Problem Relation Age of Onset  . Dementia Neg Hx   . Heart disease Brother   . Heart attack Mother   . Heart attack Father   . Lung cancer Brother   . Lung cancer Sister     Social History Social History  Substance Use Topics  . Smoking status: Former Smoker -- 0.50 packs/day for 30 years    Types: Cigarettes  . Smokeless  tobacco: Never Used  . Alcohol Use: No    Review of Systems Constitutional: No fever/chills Eyes: No visual changes. ENT: No sore throat. Cardiovascular: See history of present illness Respiratory: Denies shortness of breath. Gastrointestinal: No abdominal pain.  No nausea, no vomiting.  No diarrhea.  No constipation. Genitourinary: Negative for dysuria. Musculoskeletal: Negative for back pain. Skin: Negative for rash. Neurological: Negative for headaches, focal weakness or numbness.  10-point ROS otherwise negative.  ____________________________________________   PHYSICAL EXAM:  VITAL SIGNS: ED Triage Vitals  Enc Vitals Group     BP 01/25/16 1506 176/65 mmHg     Pulse Rate 01/25/16 1506 94     Resp 01/25/16 1506 15     Temp 01/25/16 1506 98.2 F (36.8 C)     Temp Source 01/25/16 1506 Oral     SpO2 01/25/16 1506 96 %     Weight 01/25/16 1506 155 lb (70.308 kg)     Height 01/25/16 1506  (1.803 m)     Head Cir --      Peak Flow --      Pain Score --      Pain Loc --      Pain Edu? --      Excl. in GC? --    Constitutional: Alert and oriented. Well appearing and in no acute distress. Resting comfortably with his son at bedside. Eyes: Conjunctivae are normal. PERRL. EOMI. Head: Atraumatic. Nose: No congestion/rhinnorhea. Mouth/Throat: Mucous membranes are moist.  Oropharynx non-erythematous. Neck: No stridor.   Cardiovascular: Normal rate, regular rhythm. Grossly normal heart sounds.  Good peripheral circulation. Respiratory: Normal respiratory effort.  No retractions. Lungs CTAB. Gastrointestinal: Soft and nontender. No distention. No abdominal bruits. No CVA tenderness. Musculoskeletal: No lower extremity tenderness nor edema.  No joint effusions. Neurologic:  Normal speech and language. No gross focal neurologic deficits are appreciated. No gait instability. Skin:  Skin is warm, dry and intact. No rash noted. Psychiatric: Mood and affect are normal. Speech  and behavior are normal.  ____________________________________________   LABS (all labs ordered are listed, but only abnormal results are displayed)  Labs Reviewed  CBC - Abnormal; Notable for the following:    RBC 4.23 (*)    HCT 38.7 (*)    Platelets 138 (*)    All other components within normal limits  TROPONIN I - Abnormal; Notable for the following:    Troponin I 0.04 (*)    All other components within normal limits  COMPREHENSIVE METABOLIC PANEL - Abnormal; Notable for the following:    Potassium 3.4 (*)    Glucose, Bld 103 (*)    Total Bilirubin 1.6 (*)    All other components within normal limits  BRAIN NATRIURETIC PEPTIDE  PROTIME-INR  APTT  ____________________________________________  EKG  Reviewed and interpreted by me at 1515 Ventricular rate 70 PR 220 QRS 200 QTC 510 AV paced rhythm Patient does have concerning ST depressions in V2, concordant with the QRS complex. Possibly indicative of acute ST elevation MI. However, in reviewing the patient's previous EKG he does have notable and significant repolarization seen in multiple leads which appear to be consistent with today. I paged Dr. Gwen Pounds to discuss these changes. ____________________________________________  RADIOLOGY  DG Chest 2 View (Final result) Result time: 01/25/16 15:32:41   Final result by Rad Results In Interface (01/25/16 15:32:41)   Narrative:   CLINICAL DATA: Chest pain  EXAM: CHEST 2 VIEW  COMPARISON: 09/04/2014  FINDINGS: Prior CABG. Dual lead pacer in place. Atherosclerotic aortic arch. Severe emphysema. Biapical pleural parenchymal scarring. Symmetric nipple shadows noted. Coronary atherosclerosis. Bony demineralization. No pleural effusion or airspace opacity.  IMPRESSION: 1. Severe emphysema. 2. Prior CABG. Heart size currently normal; no edema or pleural effusion.   Electronically Signed By: Gaylyn Rong M.D. On: 01/25/2016 15:32        ____________________________________________   PROCEDURES  Procedure(s) performed: None  Critical Care performed: Yes, see critical care note(s)  CRITICAL CARE Performed by: Sharyn Creamer   Total critical care time: 40 minutes  Critical care time was exclusive of separately billable procedures and treating other patients.  Critical care was necessary to treat or prevent imminent or life-threatening deterioration.  Critical care was time spent personally by me on the following activities: development of treatment plan with patient and/or surrogate as well as nursing, discussions with consultants, evaluation of patient's response to treatment, examination of patient, obtaining history from patient or surrogate, ordering and performing treatments and interventions, ordering and review of laboratory studies, ordering and review of radiographic studies, pulse oximetry and re-evaluation of patient's condition.  ____________________________________________   INITIAL IMPRESSION / ASSESSMENT AND PLAN / ED COURSE  Pertinent labs & imaging results that were available during my care of the patient were reviewed by me and considered in my medical decision making (see chart for details).     Initial page to Dr. Gwen Pounds at 3:55 PM  Second page Dr. Gwen Pounds at 4:10 PM  Sudden onset chest pain. Concerning ECG changes, although the patient does have a AV pacemaker. Patient presentation, history, and EKG findings discussed with Dr. Arnoldo Hooker at 4:28 PM. After reviewing EKG, recent clinical history Dr. Gwen Pounds advises that it would be reasonable to discuss with the patient in transfer him to [facility] or available PCI Center for a possible STEMI.  ----------------------------------------- 4:34 PM on 01/25/2016 -----------------------------------------  Patient reports that he is feeling well at this moment. Further discussed, the patient and family indicate they would like to be  transferred to [facility] with understandable seen and evaluated by interventional cardiology for possible acute heart attack.  450PM:   ____________________________________________   FINAL CLINICAL IMPRESSION(S) / ED DIAGNOSES  Final diagnoses:  Acute coronary syndrome (HCC)  Secondary hypertension, unspecified      Sharyn Creamer, MD 01/26/16 [phone]

## 2016-01-25 NOTE — ED Notes (Signed)
Pt came from [facility] with questionable STEMI.  DR Johny Chess met pt at bedside for evaluation.  Pt states he was at [facility] and felt extreme central chest pain with SOB, weakness, fatigue.  EMS gave 325 asp, 4 nitro, with relief of pain.  Pt presents here without any chest pain.  Hypertensive 212/98.

## 2016-01-25 NOTE — ED Notes (Signed)
Pt given sandwich 

## 2016-01-25 NOTE — ED Notes (Signed)
Attempted report 

## 2016-01-25 NOTE — H&P (Signed)
CARDIOLOGY INPATIENT HISTORY AND PHYSICAL EXAMINATION NOTE  Patient ID: Nathan Saunders MRN: [id number], DOB/AGE: January 11, 1932   Admit date: 01/25/2016   Primary Physician: Marguarite Arbour, MD Primary Cardiologist: Arnoldo Hooker MD  Reason for admission: chest pain  HPI: This is a 82 y.o. male with known history of MI s/p CABG ('87, LIMA to the LAD, saphenous vein graft to OM1 and PDA), HTN, HLD, moderate MR, several PCI, chronic atrial fibrillation (CHB) s/p dual chamber medtronic PPM '04, dementia and chronic angina who presented with chest pain. Patient was walking in the [employer] today when he started having chest pains and then took 2 x NTG w/o much relief. The son was accompanying the patient. They called EMS and went to [facility] from where he was transferred to [place]. He had nitropaste placed and that improved the pain.  He describes the pain as substernal in location, heavy in character, associated with exertion and relieves with rest or NTG. This happens almost every day. He is on several antianginal medications. He sometimes has to use NTG patch. Today his EKG was significantly different because of which a possible STEMI was called. However his pain improved prior to reaching [facility] It was 8/10 in intensity, radiated across the chest bilaterally. It was associated with SOB but not diaphoresis.  Of note, patient was seen by Dr. Gwen Pounds on 12/10/2015 and had a stress test which was negative for ischemia. For his angina he is on ranexa 500 mg ER, imdur 60 mg daily which was recently increased to twice daily, amlodipine 2.5 mg and toprol XL 100 mg.   Problem List: Past Medical History  Diagnosis Date  . Hypertension   . Dyslipidemia   . Atherosclerotic heart disease 1987    Multivessel disease, referred for CABG  (Cardilogist - Dr. Gwen Pounds, [facility])  . S/P CABG x 3 1987    LIMA-LAD, SVG-OM 2, SVG RCA  . Coronary artery disease involving autologous vein coronary  bypass graft with angina pectoris (HCC) 2001, 2002, 2003; 2006    [facility]: a. PCI to [facility] and SVG OM and OM in Jan '01; PTCA of OM 2 ISR in Aug '02; recurrent ISR in OM 2 - PCI with Cypher DES in Oct '03; d. SVG-OM occluded - PCI to mCx & OM2 with Promus 3.0 mm x 8 mm stents; 12/'16 -Cardiolite - Negative  . Ischemic cardiomyopathy     (EF 55% by Cardiolite in 11/2015) - but Echo in 11/2015: EF ~40% with apical akinesis and global hypokinesis, moderate LA dilation, mild RA dilation  . Chronic atrial fibrillation (HCC)   . History of complete heart block September 2004    Dual-chamber put pacemaker placed [facility])  . Cervicogenic headache   . Falls   . Alzheimer's disease 04/18/2015  . Memory disturbance 09/24/2014    progressive; vascular dementia  . Cervicogenic headache   . Cervical spondylosis   . Gait disturbance   . Benign enlargement of prostate   . Insomnia   . Anemia of chronic disease     Past Surgical History  Procedure Laterality Date  . Coronary artery bypass graft      [facility]: LIMA-LAD, SVG OM and OM 2, SVG-PDA  . Transurethral resection of prostate    . Hernia repair Bilateral   . Coronary stent placement  Jan 2001, Aug 2002, Oct 2003; 8/06    a. 1/01 -BMS to SVG-RCA and SVG-OM 2; b. ISR in SVG-OM2 PTCA; c. Re-ISR SVG-OM2 --> Cypher DES;; d. SVG-OM  occluded - PCI to mCx & OM2 with Promus 3.0 mm x 8 mm stents  . Pacemaker placement  September 2004    Dual-chamber, ([facility]/ [place]) - Dr. Darrold Junker  . Cholecystectomy  2013   . Nm myoview ltd  December 2016    [facility]: Negative Cardiolite, normal wall motion =- EF ~55%  . Transthoracic echocardiogram  December 2016     [facility]: EF ~40% with apical akinesis and global hypokinesis, moderate LA dilation, mild RA dilation     Allergies:  Allergies  Allergen Reactions  . Seroquel [Quetiapine Fumarate] Other (See Comments)    Nightmares  . Serotonin Reuptake Inhibitors (Ssris) Other (See Comments)    Nightmares?  Maybe confused for seroquel interaction (not SSRI)     Home Medications No current facility-administered medications for this encounter.   Current Outpatient Prescriptions  Medication Sig Dispense Refill  . amLODipine (NORVASC) 2.5 MG tablet Take 2.5 mg by mouth daily.    Marland Kitchen aspirin 81 MG tablet Take 81 mg by mouth daily.    Marland Kitchen atorvastatin (LIPITOR) 40 MG tablet Take 40 mg by mouth daily.    Marland Kitchen donepezil (ARICEPT) 5 MG tablet Take 1 tablet (5 mg total) by mouth at bedtime. 90 tablet 1  . finasteride (PROSCAR) 5 MG tablet Take 5 mg by mouth daily.    . isosorbide mononitrate (IMDUR) 60 MG 24 hr tablet Take 60 mg by mouth daily.    . meloxicam (MOBIC) 7.5 MG tablet Take 7.5 mg by mouth daily.    . metoprolol succinate (TOPROL-XL) 100 MG 24 hr tablet Take 100 mg by mouth daily.    Marland Kitchen NAMENDA XR 28 MG CP24 24 hr capsule Take 28 mg by mouth daily.    . nitroGLYCERIN (NITRODUR - DOSED IN MG/24 HR) 0.1 mg/hr patch Place 0.1 mg onto the skin daily as needed (for angina).     . pantoprazole (PROTONIX) 40 MG tablet Take 40 mg by mouth daily.       Family History  Problem Relation Age of Onset  . Dementia Neg Hx   . Heart disease Brother   . Heart attack Mother   . Heart attack Father   . Lung cancer Brother   . Lung cancer Sister      Social History   Social History  . Marital Status: Married    Spouse Name: N/A  . Number of Children: 1  . Years of Education: hs   Occupational History  . Retired    Social History Main Topics  . Smoking status: Former Smoker -- 0.50 packs/day for 30 years    Types: Cigarettes  . Smokeless tobacco: Never Used  . Alcohol Use: No  . Drug Use: No  . Sexual Activity: Not on file   Other Topics Concern  . Not on file   Social History Narrative   Patient is left handed.   Patient drinks 3 cups of caffeine daily.     Review of Systems: General: negative for chills, fever, night sweats or weight changes.  Cardiovascular: chest pain, dyspnea  negative for dyspnea on exertion, edema, orthopnea, palpitations, paroxysmal nocturnal dyspnea  Dermatological: negative for rash Respiratory: negative for cough or wheezing Urologic: negative for hematuria Abdominal: negative for nausea, vomiting, diarrhea, bright red blood per rectum, melena, or hematemesis Neurologic: negative for visual changes, syncope, or dizziness Endocrine: no diabetes, no hypothyroidism Immunological: no lymph adenopathy Psych: non homicidal/suicidal  Physical Exam: Vitals: BP 180/68 mmHg  Pulse 71  Resp  18  SpO2 95% General: not in acute distress Neck: JVP flat, neck supple Heart: regular rate and rhythm, S1, S2, systolic grade II/VI ejection murmur at aortic region Lungs: CTAB  GI: non tender, non distended, bowel sounds present Extremities: no edema Neuro: AAO x 3  Psych: normal affect, no anxiety   Labs:   Results for orders placed or performed during the hospital encounter of 01/25/16 (from the past 24 hour(s))  I-stat troponin, ED     Status: None   Collection Time: 01/25/16  5:58 PM  Result Value Ref Range   Troponin i, poc 0.06 0.00 - 0.08 ng/mL   Comment 3             Radiology/Studies: Dg Chest 2 View  01/25/2016  CLINICAL DATA:  Chest pain EXAM: CHEST  2 VIEW COMPARISON:  09/04/2014 FINDINGS: Prior CABG. Dual lead pacer in place. Atherosclerotic aortic arch. Severe emphysema. Biapical pleural parenchymal scarring. Symmetric nipple shadows noted. Coronary atherosclerosis. Bony demineralization. No pleural effusion or airspace opacity. IMPRESSION: 1. Severe emphysema. 2. Prior CABG. Heart size currently normal; no edema or pleural effusion. Electronically Signed   By: Gaylyn Rong M.D.   On: 01/25/2016 15:32    EKG: today showed atrioventricular pacing with ST elevation of 4 mm in the inferior leads and ST depression in the V1 and V2 of 1-2 mm. There are tall tented T waves. There is prolonged AV conduction.   Echo: 11/2015: EF ~40%  with apical akinesis and global hypokinesis, moderate LA dilation, mild RA dilation  Cardiac cath:  Ptca and stent placement of saphenous vein graft to right coronary artery and obtuse marginal 2, 1/01. ptca of obtuse marginal 2 from re-stenosis 8/02. recurrent restenosis om2 with pci and cypher stent placement 10/03. restenosis of obtus    Medical decision making:  Discussed care with the patient Discussed care with the physician on the phone Reviewed labs and imaging personally Reviewed prior records  ASSESSMENT AND PLAN:  This is a 82 y.o. male with known history of MI s/p CABG ('87, LIMA to the LAD, saphenous vein graft to OM1 and PDA), HTN, HLD, moderate MR, several PCI, chronic atrial fibrillation (CHB) s/p dual chamber medtronic PPM '04, dementia and chronic angina who presented with chest pain.   Principal Problem:   Unstable angina (HCC) Active Problems:   Memory difficulty   Alzheimer's disease   Hypertension   MR (mental retardation), moderate   Hypertensive urgency   Coronary artery disease involving native heart with unstable angina pectoris (HCC)   Dyslipidemia  UA - initial trop x1 negative, EKG positive for possible ischemia, elevated BP, resolved with NTG  - cycle troponin, recent echo results reviewed, lipid panel, TSH, hbA1c - NTG drip, aspirin, plavix and high dose statin   Chest pain Differential HTN urgency vs. Unstable angina Continue imdur, amlodipine, toprol xl and ranexa.   Hypertensive urgency Could be related to ACS vs. Reactive HTN Started on NTG drip   Dyslipidemia Lipid panel Increased lipitor to 80 mg daily  Dementia  Continue aricept and namenda  CAD within native vessels s/p CABG Recent negative stress test, consider cardiac cath after discussion with the family and cardiologist Dr. Gwen Pounds  NPO post midnight  Signed, Joellyn Rued, MD MS 01/25/2016, 8:26 PM

## 2016-01-25 NOTE — ED Notes (Signed)
Cards fellow paged @ [phone]

## 2016-01-25 NOTE — ED Notes (Signed)
Cards fellow repaged @ [phone]

## 2016-01-26 ENCOUNTER — Encounter (HOSPITAL_COMMUNITY)
Admission: EM | Disposition: A | Discharge status: Home / Self Care | Payer: Self-pay | Source: Home / Self Care | Attending: Internal Medicine

## 2016-01-26 DIAGNOSIS — I482 Chronic atrial fibrillation: Secondary | ICD-10-CM

## 2016-01-26 HISTORY — PX: CARDIAC CATHETERIZATION: SHX172

## 2016-01-26 LAB — LIPID PANEL
CHOL/HDL RATIO: 4.1 ratio
Cholesterol: 106 mg/dL (ref 0–200)
HDL: 26 mg/dL — AB (ref >40)
LDL CALC: 50 mg/dL (ref 0–99)
TRIGLYCERIDES: 148 mg/dL (ref <150)
VLDL: 30 mg/dL (ref 0–40)

## 2016-01-26 LAB — CBC
HEMATOCRIT: 37.3 % — AB (ref 39.0–52.0)
Hemoglobin: 13.2 g/dL (ref 13.0–17.0)
MCH: 32.4 pg (ref 26.0–34.0)
MCHC: 35.4 g/dL (ref 30.0–36.0)
MCV: 91.6 fL (ref 78.0–100.0)
PLATELETS: 129 10*3/uL — AB (ref 150–400)
RBC: 4.07 MIL/uL — ABNORMAL LOW (ref 4.22–5.81)
RDW: 12.7 % (ref 11.5–15.5)
WBC: 6.1 10*3/uL (ref 4.0–10.5)

## 2016-01-26 LAB — BASIC METABOLIC PANEL
Anion gap: 8 (ref 5–15)
BUN: 12 mg/dL (ref 6–20)
CHLORIDE: 107 mmol/L (ref 101–111)
CO2: 28 mmol/L (ref 22–32)
CREATININE: 0.91 mg/dL (ref 0.61–1.24)
Calcium: 9 mg/dL (ref 8.9–10.3)
GFR calc Af Amer: >60 mL/min (ref >60)
GFR calc non Af Amer: >60 mL/min (ref >60)
GLUCOSE: 90 mg/dL (ref 65–99)
Potassium: 3.1 mmol/L — ABNORMAL LOW (ref 3.5–5.1)
SODIUM: 143 mmol/L (ref 135–145)

## 2016-01-26 LAB — CREATININE, SERUM
CREATININE: 0.99 mg/dL (ref 0.61–1.24)
GFR calc Af Amer: >60 mL/min (ref >60)
GFR calc non Af Amer: >60 mL/min (ref >60)

## 2016-01-26 LAB — TROPONIN I
Troponin I: 0.03 ng/mL (ref <0.031)
Troponin I: 0.04 ng/mL — ABNORMAL HIGH (ref <0.031)
Troponin I: 0.06 ng/mL — ABNORMAL HIGH (ref <0.031)

## 2016-01-26 LAB — TSH: TSH: 1.764 u[IU]/mL (ref 0.350–4.500)

## 2016-01-26 LAB — MRSA PCR SCREENING: MRSA BY PCR: NEGATIVE

## 2016-01-26 SURGERY — LEFT HEART CATH AND CORONARY ANGIOGRAPHY

## 2016-01-26 MED ORDER — MIDAZOLAM HCL 2 MG/2ML IJ SOLN
INTRAMUSCULAR | Status: AC
  Filled 2016-01-26: qty 2

## 2016-01-26 MED ORDER — SODIUM CHLORIDE 0.9% FLUSH: 3.0000 mL | INTRAVENOUS | Status: DC | PRN

## 2016-01-26 MED ORDER — SODIUM CHLORIDE 0.9% FLUSH
3.0000 mL | Freq: Two times a day (BID) | INTRAVENOUS | Status: DC
  Administered 2016-01-26 – 2016-01-28 (×3): 3 mL via INTRAVENOUS

## 2016-01-26 MED ORDER — HEPARIN (PORCINE) IN NACL 2-0.9 UNIT/ML-% IJ SOLN
INTRAMUSCULAR | Status: AC
  Filled 2016-01-26: qty 1000

## 2016-01-26 MED ORDER — HEPARIN SODIUM (PORCINE) 1000 UNIT/ML IJ SOLN
INTRAMUSCULAR | Status: AC
  Filled 2016-01-26: qty 1

## 2016-01-26 MED ORDER — IOHEXOL 350 MG/ML SOLN
INTRAVENOUS | Status: DC | PRN
  Administered 2016-01-26: 115 mL via INTRA_ARTERIAL

## 2016-01-26 MED ORDER — ALUM & MAG HYDROXIDE-SIMETH 200-200-20 MG/5ML PO SUSP
30.0000 mL | Freq: Once | ORAL | Status: AC
  Administered 2016-01-26: 30 mL via ORAL
  Filled 2016-01-26: qty 30

## 2016-01-26 MED ORDER — SODIUM CHLORIDE 0.9 % IV SOLN: 250.0000 mL | INTRAVENOUS | Status: DC | PRN

## 2016-01-26 MED ORDER — HEPARIN (PORCINE) IN NACL 2-0.9 UNIT/ML-% IJ SOLN
INTRAMUSCULAR | Status: DC | PRN
Start: 2016-01-26 — End: 2016-01-26
  Administered 2016-01-26: 17:00:00

## 2016-01-26 MED ORDER — LIDOCAINE HCL (PF) 1 % IJ SOLN
INTRAMUSCULAR | Status: AC
  Filled 2016-01-26: qty 30

## 2016-01-26 MED ORDER — SODIUM CHLORIDE 0.9 % WEIGHT BASED INFUSION: 1.0000 mL/kg/h | INTRAVENOUS | Status: DC

## 2016-01-26 MED ORDER — VERAPAMIL HCL 2.5 MG/ML IV SOLN
INTRAVENOUS | Status: DC | PRN
  Administered 2016-01-26: 10 mL via INTRA_ARTERIAL

## 2016-01-26 MED ORDER — SODIUM CHLORIDE 0.9 % WEIGHT BASED INFUSION: 3.0000 mL/kg/h | INTRAVENOUS | Status: AC

## 2016-01-26 MED ORDER — VERAPAMIL HCL 2.5 MG/ML IV SOLN
INTRAVENOUS | Status: AC
  Filled 2016-01-26: qty 2

## 2016-01-26 MED ORDER — POTASSIUM CHLORIDE CRYS ER 20 MEQ PO TBCR
40.0000 meq | EXTENDED_RELEASE_TABLET | Freq: Once | ORAL | Status: AC
  Administered 2016-01-26: 40 meq via ORAL
  Filled 2016-01-26: qty 2

## 2016-01-26 MED ORDER — SODIUM CHLORIDE 0.9% FLUSH
3.0000 mL | Freq: Two times a day (BID) | INTRAVENOUS | Status: DC
Start: 2016-01-26 — End: 2016-01-26

## 2016-01-26 MED ORDER — MIDAZOLAM HCL 2 MG/2ML IJ SOLN
INTRAMUSCULAR | Status: DC | PRN
  Administered 2016-01-26: 1 mg via INTRAVENOUS

## 2016-01-26 MED ORDER — LIDOCAINE HCL (PF) 1 % IJ SOLN
INTRAMUSCULAR | Status: DC | PRN
Start: 2016-01-26 — End: 2016-01-26
  Administered 2016-01-26: 5 mL via INTRADERMAL

## 2016-01-26 MED ORDER — HEPARIN SODIUM (PORCINE) 1000 UNIT/ML IJ SOLN
INTRAMUSCULAR | Status: DC | PRN
  Administered 2016-01-26: 4000 [IU] via INTRAVENOUS

## 2016-01-26 MED ORDER — SODIUM CHLORIDE 0.9 % IV SOLN
INTRAVENOUS | Status: AC
  Administered 2016-01-26: 20:00:00 via INTRAVENOUS

## 2016-01-26 MED ORDER — ACETAMINOPHEN 325 MG PO TABS: 650.0000 mg | ORAL_TABLET | ORAL | Status: DC | PRN

## 2016-01-26 MED ORDER — LORAZEPAM 2 MG/ML IJ SOLN
0.5000 mg | INTRAMUSCULAR | Status: DC
  Administered 2016-01-26 – 2016-01-27 (×5): 0.5 mg via INTRAVENOUS
  Filled 2016-01-26 (×5): qty 1

## 2016-01-26 SURGICAL SUPPLY — 13 items
CATH EXPO 5F MPA-1 (CATHETERS) ×3 IMPLANT
CATH INFINITI 5 FR JL3.5 (CATHETERS) ×3 IMPLANT
CATH INFINITI 5FR ANG PIGTAIL (CATHETERS) ×3 IMPLANT
CATH INFINITI 5FR JL4 (CATHETERS) ×3 IMPLANT
CATH INFINITI JR4 5F (CATHETERS) ×3 IMPLANT
DEVICE RAD COMP TR BAND LRG (VASCULAR PRODUCTS) ×3 IMPLANT
GLIDESHEATH SLEND SS 6F .021 (SHEATH) ×3 IMPLANT
KIT HEART LEFT (KITS) ×3 IMPLANT
PACK CARDIAC CATHETERIZATION (CUSTOM PROCEDURE TRAY) ×3 IMPLANT
SYR MEDRAD MARK V 150ML (SYRINGE) ×3 IMPLANT
TRANSDUCER W/STOPCOCK (MISCELLANEOUS) ×3 IMPLANT
TUBING CIL FLEX 10 FLL-RA (TUBING) ×3 IMPLANT
WIRE SAFE-T 1.5MM-J .035X260CM (WIRE) ×3 IMPLANT

## 2016-01-26 NOTE — Progress Notes (Signed)
Subjective:  No chest pain presently, but he has had recurrent symptoms despite prior   Objective:   Vital Signs : Filed Vitals:   01/26/16 0546 01/26/16 0600 01/26/16 0700 01/26/16 0808  BP: 167/68 159/66 148/61   Pulse: 63 62 61   Temp:    97.5 F (36.4 C)  TempSrc:    Oral  Resp:  17 20   Height:      Weight:      SpO2:  94% 94%     Intake/Output from previous day:  Intake/Output Summary (Last 24 hours) at 01/26/16 0911 Last data filed at 01/26/16 0600  Gross per 24 hour  Intake 240.37 ml  Output    400 ml  Net -159.63 ml    I/O since admission: -159  Wt Readings from Last 3 Encounters:  01/26/16 151 lb 10.8 oz (68.8 kg)  01/25/16 155 lb (70.308 kg)  10/24/15 155 lb 8 oz (70.534 kg)    Medications: . amLODipine  2.5 mg Oral Daily  . aspirin EC  81 mg Oral Daily  . atorvastatin  40 mg Oral Daily  . donepezil  5 mg Oral QHS  . finasteride  5 mg Oral Daily  . heparin  5,000 Units Subcutaneous 3 times per day  . isosorbide mononitrate  60 mg Oral Daily  . memantine  28 mg Oral Daily  . metoprolol succinate  100 mg Oral Daily  . pantoprazole  40 mg Oral Daily    . nitroGLYCERIN 25 mcg/min (01/26/16 0527)    Physical Exam:   General appearance: cooperative and no distress Neck: no adenopathy, no carotid bruit, no JVD, supple, symmetrical, trachea midline and thyroid not enlarged, symmetric, no tenderness/mass/nodules Lungs: no wheezing Heart: RR paced; 1-2/6 sem; no s3; no rub Abdomen: soft, non-tender; bowel sounds normal; no masses,  no organomegaly Extremities: no edema, redness or tenderness in the calves or thighs Pulses: 2+ and symmetric Skin: Skin color, texture, turgor normal. No rashes or lesions Neurologic: nonfocal; ?nvasive eval 12/16 dementia   Rate: 68  Rhythm: AV paced  ECG (independently read by me): AV paced at 68  Lab Results:   Recent Labs  01/25/16 1653 01/25/16 2332 01/26/16 0430  NA 143  --  143  K 3.4*  --  3.1*    CL 109  --  107  CO2 28  --  28  GLUCOSE 103*  --  90  BUN 16  --  12  CREATININE 0.97 0.99 0.91  CALCIUM 9.4  --  9.0    Hepatic Function Latest Ref Rng 01/25/2016 09/04/2014 02/04/2013  Total Protein 6.5 - 8.1 g/dL 6.7 6.7 6.4  Albumin 3.5 - 5.0 g/dL 3.9 3.5 3.4  AST 15 - 41 U/L '21 16 24  ' ALT 17 - 63 U/L '17 27 29  ' Alk Phosphatase 38 - 126 U/L 90 102 101  Total Bilirubin 0.3 - 1.2 mg/dL 1.6(H) 0.9 0.9     Recent Labs  01/25/16 1453 01/25/16 2332 01/26/16 0430  WBC 5.1 5.4 6.1  HGB 13.5 13.0 13.2  HCT 38.7* 37.6* 37.3*  MCV 91.4 93.5 91.6  PLT 138* 137* 129*     Recent Labs  01/25/16 1653 01/25/16 2332 01/26/16 0430  TROPONINI 0.04* 0.06* 0.04*    Lab Results  Component Value Date   TSH 1.764 01/25/2016   No results for input(s): HGBA1C in the last 72 hours.   Recent Labs  01/25/16 1653  PROT 6.7  ALBUMIN 3.9  AST 21  ALT 17  ALKPHOS 90  BILITOT 1.6*    Recent Labs  01/25/16 1653  INR 0.97   BNP (last 3 results)  Recent Labs  01/25/16 1453  BNP 89.0    ProBNP (last 3 results) No results for input(s): PROBNP in the last 8760 hours.   Lipid Panel     Component Value Date/Time   CHOL 106 01/25/2016 2332   TRIG 148 01/25/2016 2332   HDL 26* 01/25/2016 2332   CHOLHDL 4.1 01/25/2016 2332   VLDL 30 01/25/2016 2332   LDLCALC 50 01/25/2016 2332     Imaging:  Dg Chest 2 View  01/25/2016  CLINICAL DATA:  Chest pain EXAM: CHEST  2 VIEW COMPARISON:  09/04/2014 FINDINGS: Prior CABG. Dual lead pacer in place. Atherosclerotic aortic arch. Severe emphysema. Biapical pleural parenchymal scarring. Symmetric nipple shadows noted. Coronary atherosclerosis. Bony demineralization. No pleural effusion or airspace opacity. IMPRESSION: 1. Severe emphysema. 2. Prior CABG. Heart size currently normal; no edema or pleural effusion. Electronically Signed   By: Van Clines M.D.   On: 01/25/2016 15:32      Assessment/Plan:   Principal Problem:    Unstable angina ([place]) Active Problems:   Memory difficulty   Alzheimer's disease   Hypertension   MR (mental retardation), moderate   Hypertensive urgency   Coronary artery disease involving native heart with unstable angina pectoris ([place])   Dyslipidemia  1. CAD with unstable angina: Pt is s/p CABG at [facility] in 1987. S/P PCI/stent to SVG to RCA and OM2 in 1/01 and PTCA of OM2 8/2 8/01, with recurrent re-stosis OM2 with PCI/stent 10/03. 2. Unstable angina; recurrent chest pain despite noninvasive eval 12/16;  3. H/O MR 4. Permanent AF;  According to son pt was on xarelto but stopped 1 year ago due to urinary bleeding; was just on ASA 81 mg PTA 5. S/P AV pacemaker 6. Emphysema 7. Dementia: on Aricept and Namenda  With recurrent chest pain worrisome for UAP; 30 yrs s/p CABG with subsequent remote PCI's; mildly positive troponin; I have recommended definitive cardiac catheterization. I spoke at length with son and patient. The risks and benefits of a cardiac catheterization including, but not limited to, death, stroke, MI, kidney damage and bleeding were discussed with the patient who indicates understanding and agrees to proceed.  Will set up for later today as schedule allows.   Time spent: 35 minutes  Troy Sine, MD, [facility] 01/26/2016, 9:11 AM

## 2016-01-26 NOTE — Progress Notes (Signed)
Paged md about pt's bp and request for something for indigestion.  Also potassium 3.1 this am.  Gave am norvasc protonix, lopressor, and asa.  Not time to give another prn HTZ for sbp greater than 160.  Will continue to monitor Nathan Saunders

## 2016-01-26 NOTE — Interval H&P Note (Signed)
Cath Lab Visit (complete for each Cath Lab visit)  Clinical Evaluation Leading to the Procedure:   ACS: Yes.    Non-ACS:    Anginal Classification: CCS IV  Anti-ischemic medical therapy: Maximal Therapy (2 or more classes of medications)  Non-Invasive Test Results: No non-invasive testing performed  Prior CABG: No previous CABG      History and Physical Interval Note:  01/26/2016 5:07 PM  Nathan Saunders  has presented today for surgery, with the diagnosis of cp  The various methods of treatment have been discussed with the patient and family. After consideration of risks, benefits and other options for treatment, the patient has consented to  Procedure(s): Left Heart Cath and Coronary Angiography (N/A) as a surgical intervention .  The patient's history has been reviewed, patient examined, no change in status, stable for surgery.  I have reviewed the patient's chart and labs.  Questions were answered to the patient's satisfaction.     Tonny Bollman

## 2016-01-26 NOTE — Care Management Note (Signed)
Case Management Note  Patient Details  Name: Nathan Saunders MRN: [id number] Date of Birth: 1932-05-13  Subjective/Objective:               Adm w angina     Action/Plan: lives w fam, pcp dr sparks   Expected Discharge Date:                Expected Discharge Plan:     In-House Referral:     Discharge planning Services     Post Acute Care Choice:    Choice offered to:     DME Arranged:    DME Agency:     HH Arranged:    HH Agency:     Status of Service:     Medicare Important Message Given:    Date Medicare IM Given:    Medicare IM give by:    Date Additional Medicare IM Given:    Additional Medicare Important Message give by:     If discussed at Long Length of Stay Meetings, dates discussed:    Additional Comments: ur review done  Hanley Hays, RN 01/26/2016, 8:48 AM

## 2016-01-26 NOTE — H&P (View-Only) (Signed)
Subjective:  No chest pain presently, but he has had recurrent symptoms despite prior   Objective:   Vital Signs : Filed Vitals:   01/26/16 0546 01/26/16 0600 01/26/16 0700 01/26/16 0808  BP: 167/68 159/66 148/61   Pulse: 63 62 61   Temp:    97.5 F (36.4 C)  TempSrc:    Oral  Resp:  17 20   Height:      Weight:      SpO2:  94% 94%     Intake/Output from previous day:  Intake/Output Summary (Last 24 hours) at 01/26/16 0911 Last data filed at 01/26/16 0600  Gross per 24 hour  Intake 240.37 ml  Output    400 ml  Net -159.63 ml    I/O since admission: -159  Wt Readings from Last 3 Encounters:  01/26/16 151 lb 10.8 oz (68.8 kg)  01/25/16 155 lb (70.308 kg)  10/24/15 155 lb 8 oz (70.534 kg)    Medications: . amLODipine  2.5 mg Oral Daily  . aspirin EC  81 mg Oral Daily  . atorvastatin  40 mg Oral Daily  . donepezil  5 mg Oral QHS  . finasteride  5 mg Oral Daily  . heparin  5,000 Units Subcutaneous 3 times per day  . isosorbide mononitrate  60 mg Oral Daily  . memantine  28 mg Oral Daily  . metoprolol succinate  100 mg Oral Daily  . pantoprazole  40 mg Oral Daily    . nitroGLYCERIN 25 mcg/min (01/26/16 0527)    Physical Exam:   General appearance: cooperative and no distress Neck: no adenopathy, no carotid bruit, no JVD, supple, symmetrical, trachea midline and thyroid not enlarged, symmetric, no tenderness/mass/nodules Lungs: no wheezing Heart: RR paced; 1-2/6 sem; no s3; no rub Abdomen: soft, non-tender; bowel sounds normal; no masses,  no organomegaly Extremities: no edema, redness or tenderness in the calves or thighs Pulses: 2+ and symmetric Skin: Skin color, texture, turgor normal. No rashes or lesions Neurologic: nonfocal; ?nvasive eval 12/16 dementia   Rate: 68  Rhythm: AV paced  ECG (independently read by me): AV paced at 68  Lab Results:   Recent Labs  01/25/16 1653 01/25/16 2332 01/26/16 0430  NA 143  --  143  K 3.4*  --  3.1*    CL 109  --  107  CO2 28  --  28  GLUCOSE 103*  --  90  BUN 16  --  12  CREATININE 0.97 0.99 0.91  CALCIUM 9.4  --  9.0    Hepatic Function Latest Ref Rng 01/25/2016 09/04/2014 02/04/2013  Total Protein 6.5 - 8.1 g/dL 6.7 6.7 6.4  Albumin 3.5 - 5.0 g/dL 3.9 3.5 3.4  AST 15 - 41 U/L '21 16 24  ' ALT 17 - 63 U/L '17 27 29  ' Alk Phosphatase 38 - 126 U/L 90 102 101  Total Bilirubin 0.3 - 1.2 mg/dL 1.6(H) 0.9 0.9     Recent Labs  01/25/16 1453 01/25/16 2332 01/26/16 0430  WBC 5.1 5.4 6.1  HGB 13.5 13.0 13.2  HCT 38.7* 37.6* 37.3*  MCV 91.4 93.5 91.6  PLT 138* 137* 129*     Recent Labs  01/25/16 1653 01/25/16 2332 01/26/16 0430  TROPONINI 0.04* 0.06* 0.04*    Lab Results  Component Value Date   TSH 1.764 01/25/2016   No results for input(s): HGBA1C in the last 72 hours.   Recent Labs  01/25/16 1653  PROT 6.7  ALBUMIN 3.9  AST 21  ALT 17  ALKPHOS 90  BILITOT 1.6*    Recent Labs  01/25/16 1653  INR 0.97   BNP (last 3 results)  Recent Labs  01/25/16 1453  BNP 89.0    ProBNP (last 3 results) No results for input(s): PROBNP in the last 8760 hours.   Lipid Panel     Component Value Date/Time   CHOL 106 01/25/2016 2332   TRIG 148 01/25/2016 2332   HDL 26* 01/25/2016 2332   CHOLHDL 4.1 01/25/2016 2332   VLDL 30 01/25/2016 2332   LDLCALC 50 01/25/2016 2332     Imaging:  Dg Chest 2 View  01/25/2016  CLINICAL DATA:  Chest pain EXAM: CHEST  2 VIEW COMPARISON:  09/04/2014 FINDINGS: Prior CABG. Dual lead pacer in place. Atherosclerotic aortic arch. Severe emphysema. Biapical pleural parenchymal scarring. Symmetric nipple shadows noted. Coronary atherosclerosis. Bony demineralization. No pleural effusion or airspace opacity. IMPRESSION: 1. Severe emphysema. 2. Prior CABG. Heart size currently normal; no edema or pleural effusion. Electronically Signed   By: Van Clines M.D.   On: 01/25/2016 15:32      Assessment/Plan:   Principal Problem:    Unstable angina ([place]) Active Problems:   Memory difficulty   Alzheimer's disease   Hypertension   MR (mental retardation), moderate   Hypertensive urgency   Coronary artery disease involving native heart with unstable angina pectoris ([place])   Dyslipidemia  1. CAD with unstable angina: Pt is s/p CABG at [facility] in [facility]. S/P PCI/stent to SVG to RCA and OM2 in 1/01 and PTCA of OM2 8/2 8/01, with recurrent re-stosis OM2 with PCI/stent 10/03. 2. Unstable angina; recurrent chest pain despite noninvasive eval 12/16;  3. H/O MR 4. Permanent AF;  According to son pt was on xarelto but stopped 1 year ago due to urinary bleeding; was just on ASA 81 mg PTA 5. S/P AV pacemaker 6. Emphysema 7. Dementia: on Aricept and Namenda  With recurrent chest pain worrisome for UAP; 30 yrs s/p CABG with subsequent remote PCI's; mildly positive troponin; I have recommended definitive cardiac catheterization. I spoke at length with son and patient. The risks and benefits of a cardiac catheterization including, but not limited to, death, stroke, MI, kidney damage and bleeding were discussed with the patient who indicates understanding and agrees to proceed.  Will set up for later today as schedule allows.   Time spent: 35 minutes  Troy Sine, MD, [facility] 01/26/2016, 9:11 AM

## 2016-01-27 ENCOUNTER — Encounter (HOSPITAL_COMMUNITY): Payer: Self-pay | Admitting: Cardiovascular Disease

## 2016-01-27 DIAGNOSIS — F0280 Dementia in other diseases classified elsewhere without behavioral disturbance: Secondary | ICD-10-CM

## 2016-01-27 DIAGNOSIS — G300 Alzheimer's disease with early onset: Secondary | ICD-10-CM

## 2016-01-27 LAB — BASIC METABOLIC PANEL
ANION GAP: 7 (ref 5–15)
BUN: 14 mg/dL (ref 6–20)
CO2: 27 mmol/L (ref 22–32)
Calcium: 8.9 mg/dL (ref 8.9–10.3)
Chloride: 108 mmol/L (ref 101–111)
Creatinine, Ser: 0.96 mg/dL (ref 0.61–1.24)
GFR calc non Af Amer: >60 mL/min (ref >60)
GLUCOSE: 83 mg/dL (ref 65–99)
POTASSIUM: 3.6 mmol/L (ref 3.5–5.1)
Sodium: 142 mmol/L (ref 135–145)

## 2016-01-27 LAB — HEMOGLOBIN A1C
Hgb A1c MFr Bld: 5.2 % (ref 4.8–5.6)
Mean Plasma Glucose: 103 mg/dL

## 2016-01-27 LAB — GLUCOSE, CAPILLARY: GLUCOSE-CAPILLARY: 80 mg/dL (ref 65–99)

## 2016-01-27 MED ORDER — AMLODIPINE BESYLATE 5 MG PO TABS
5.0000 mg | ORAL_TABLET | Freq: Every day | ORAL | Status: DC
  Administered 2016-01-27 – 2016-01-28 (×2): 5 mg via ORAL
  Filled 2016-01-27 (×2): qty 1

## 2016-01-27 MED ORDER — LORAZEPAM 2 MG/ML IJ SOLN
0.5000 mg | Freq: Once | INTRAMUSCULAR | Status: AC
  Administered 2016-01-27: 0.5 mg via INTRAVENOUS
  Filled 2016-01-27: qty 1

## 2016-01-27 NOTE — Progress Notes (Signed)
Subjective:  No chest pain presently, Still confused; agitated during the night, did not sleep; He took his TR band off.  Objective:   Vital Signs : Filed Vitals:   01/27/16 0530 01/27/16 0600 01/27/16 0630 01/27/16 0756  BP: 107/43 115/48 131/50 121/47  Pulse: 59 63 70 72  Temp:    99 F (37.2 C)  TempSrc:    Axillary  Resp: '19 20 19 26  ' Height:      Weight:      SpO2: 96% 97% 98% 99%    Intake/Output from previous day:  Intake/Output Summary (Last 24 hours) at 01/27/16 0918 Last data filed at 01/27/16 0000  Gross per 24 hour  Intake 999.94 ml  Output    600 ml  Net 399.94 ml    I/O since admission: +377  Wt Readings from Last 3 Encounters:  01/27/16 151 lb 7.3 oz (68.7 kg)  01/25/16 155 lb (70.308 kg)  10/24/15 155 lb 8 oz (70.534 kg)    Medications: . amLODipine  2.5 mg Oral Daily  . aspirin EC  81 mg Oral Daily  . atorvastatin  40 mg Oral Daily  . donepezil  5 mg Oral QHS  . finasteride  5 mg Oral Daily  . heparin  5,000 Units Subcutaneous 3 times per day  . isosorbide mononitrate  60 mg Oral Daily  . LORazepam  0.5 mg Intravenous Q4H  . memantine  28 mg Oral Daily  . metoprolol succinate  100 mg Oral Daily  . pantoprazole  40 mg Oral Daily  . sodium chloride flush  3 mL Intravenous Q12H    . nitroGLYCERIN 25 mcg/min (01/26/16 0527)    Physical Exam:   General appearance: cooperative and no distress Neck: no adenopathy, no carotid bruit, no JVD, supple, symmetrical, trachea midline and thyroid not enlarged, symmetric, no tenderness/mass/nodules Lungs: no wheezing Heart: RR paced; 1-2/6 sem; no s3; no rub Abdomen: soft, non-tender; bowel sounds normal; no masses,  no organomegaly Extremities: no edema, redness or tenderness in the calves or thighs Pulses: 2+ and symmetric; good L radial pulse Skin: ecchymosis L forearm  Neurologic: nonfocal; ?nvasive eval 12/16 dementia   Rate: 87  Rhythm: AV paced  ECG (independently read by me): AV paced  at 68  Lab Results:   Recent Labs  01/25/16 1653 01/25/16 2332 01/26/16 0430  NA 143  --  143  K 3.4*  --  3.1*  CL 109  --  107  CO2 28  --  28  GLUCOSE 103*  --  90  BUN 16  --  12  CREATININE 0.97 0.99 0.91  CALCIUM 9.4  --  9.0    Hepatic Function Latest Ref Rng 01/25/2016 09/04/2014 02/04/2013  Total Protein 6.5 - 8.1 g/dL 6.7 6.7 6.4  Albumin 3.5 - 5.0 g/dL 3.9 3.5 3.4  AST 15 - 41 U/L '21 16 24  ' ALT 17 - 63 U/L '17 27 29  ' Alk Phosphatase 38 - 126 U/L 90 102 101  Total Bilirubin 0.3 - 1.2 mg/dL 1.6(H) 0.9 0.9     Recent Labs  01/25/16 1453 01/25/16 2332 01/26/16 0430  WBC 5.1 5.4 6.1  HGB 13.5 13.0 13.2  HCT 38.7* 37.6* 37.3*  MCV 91.4 93.5 91.6  PLT 138* 137* 129*     Recent Labs  01/25/16 2332 01/26/16 0430 01/26/16 1140  TROPONINI 0.06* 0.04* 0.03    Lab Results  Component Value Date   TSH 1.764 01/25/2016    Recent  Labs  01/25/16 2332  HGBA1C 5.2     Recent Labs  01/25/16 1653  PROT 6.7  ALBUMIN 3.9  AST 21  ALT 17  ALKPHOS 90  BILITOT 1.6*    Recent Labs  01/25/16 1653  INR 0.97   BNP (last 3 results)  Recent Labs  01/25/16 1453  BNP 89.0    ProBNP (last 3 results) No results for input(s): PROBNP in the last 8760 hours.   Lipid Panel     Component Value Date/Time   CHOL 106 01/25/2016 2332   TRIG 148 01/25/2016 2332   HDL 26* 01/25/2016 2332   CHOLHDL 4.1 01/25/2016 2332   VLDL 30 01/25/2016 2332   LDLCALC 50 01/25/2016 2332     Imaging:  Dg Chest 2 View  01/25/2016  CLINICAL DATA:  Chest pain EXAM: CHEST  2 VIEW COMPARISON:  09/04/2014 FINDINGS: Prior CABG. Dual lead pacer in place. Atherosclerotic aortic arch. Severe emphysema. Biapical pleural parenchymal scarring. Symmetric nipple shadows noted. Coronary atherosclerosis. Bony demineralization. No pleural effusion or airspace opacity. IMPRESSION: 1. Severe emphysema. 2. Prior CABG. Heart size currently normal; no edema or pleural effusion. Electronically  Signed   By: Van Clines M.D.   On: 01/25/2016 15:32   CARDIAC CATH  Conclusion     Prox RCA lesion, 100% stenosed.  LIMA .  Sequential SVG .  Prox Graft lesion, before 1st Mrg, 100% stenosed.  SVG .  SVG-PDA is patent without significant stenosis  Prox Graft lesion, 25% stenosed. The lesion was previously treated with a stent (unknown type).  Prox LAD lesion, 75% stenosed.  The left ventricular systolic function is normal.  1. Three vessel CAD with total occlusion of the RCA, severe stenosis of the proximal LAD, and continued patency of the left circumflex stent 2. S/P CABG with patency of the LIMA-LAD and SVG-R PDA, chronic occlusion of the sequential graft to the first and second OM branches.  3. Mild LV contraction abnormality with preserved overall LVEF  Recommend: continued medical therapy. FU Dr Nehemiah Massed.        Assessment/Plan:   Principal Problem:   Unstable angina (HCC) Active Problems:   Memory difficulty   Alzheimer's disease   Hypertension   MR (mental retardation), moderate   Hypertensive urgency   Coronary artery disease involving native heart with unstable angina pectoris ([place])   Dyslipidemia  1. CAD with unstable angina: Pt is s/p CABG at [facility] in 1987. S/P PCI/stent to SVG to RCA and OM2 in 1/01 and PTCA of OM2 8/2 8/01, with recurrent re-stenosis OM2 with PCI/stent 10/03. Cath yesterday with patent LIMA and SVG to RCA; occluded sequential SVG to LCX with patent stent in OM1 2. Unstable angina; currently pain free 3. HypoK: will recheck toady and replete if needed 3. H/O MR 4. Permanent AF;  According to son pt was on xarelto but stopped 1 year ago due to urinary bleeding; was just on ASA 81 mg PTA 5. S/P AV pacemaker 6. Emphysema 7. Dementia: on Aricept and Namenda   Cath data reviewed with son.  Will increase amlodipine to 5 mg daily. Continue BB and nitrates as prescribed.  Not alert; did not sleep last night.  Will try to  transfer to telemetry this afternoon and probable dc tomorrow.   Troy Sine, MD, [facility] 01/27/2016, 9:18 AM

## 2016-01-27 NOTE — Progress Notes (Signed)
eLink Physician-Brief Progress Note Patient Name: Nathan Saunders DOB: [birth year] MRN: [id number]   Date of Service  01/27/2016  HPI/Events of Note  Agitation.  eICU Interventions  Ativan 0.5 mg IV x1.     Intervention Category Major Interventions: Other:  Icker Swigert 01/27/2016, 1:37 AM

## 2016-01-27 NOTE — Progress Notes (Addendum)
Patient extremely agitated and confused. Patient re-oriented frequently and has a nurse tech sitting in room as a Comptroller. Patient demanding to leave the room and pulled air balloon off of TR band. Would not sit down in the bed and at risk for falls. Attempted to reorient patient and distract him with an activity blanket, tv, music, and conversation. Dr. Sherren Kerns ordered restraints for patient at 2151. Soft wrist restraints were applied at 2300 as alternative methods were tried first. Pola Corn paged at 0130 due to patient still being severely agitated. Received orders for an additional 0.5mg  of ativan. Dr. Leo Grosser renewed wrist restraints for additional 4 hours. The restraint orders were ordered as behavioral violent restraints instead of nonviolent medical restraints by accident. Dr. Leo Grosser tried to adjust this but was unable to take out the order. Soft wrist restraints applied only with q2hr monitoring. Restraints discontinued at 4am. Will continue to monitor.

## 2016-01-27 NOTE — Progress Notes (Signed)
Pt with scheduled dose of 0.5mg  ativan, pt given , MD on call with cardiology notified. Pt alert oriented x1 when ativan given, VSS. Pt now in bed easily aroused, resting comfortably. Will continue to monitor. Huel Coventry, RN

## 2016-01-28 DIAGNOSIS — I10 Essential (primary) hypertension: Secondary | ICD-10-CM

## 2016-01-28 DIAGNOSIS — E876 Hypokalemia: Secondary | ICD-10-CM

## 2016-01-28 DIAGNOSIS — R413 Other amnesia: Secondary | ICD-10-CM

## 2016-01-28 DIAGNOSIS — I25700 Atherosclerosis of coronary artery bypass graft(s), unspecified, with unstable angina pectoris: Secondary | ICD-10-CM

## 2016-01-28 LAB — BASIC METABOLIC PANEL
Anion gap: 8 (ref 5–15)
BUN: 16 mg/dL (ref 6–20)
CALCIUM: 8.7 mg/dL — AB (ref 8.9–10.3)
CO2: 22 mmol/L (ref 22–32)
CREATININE: 1.01 mg/dL (ref 0.61–1.24)
Chloride: 109 mmol/L (ref 101–111)
GFR calc Af Amer: >60 mL/min (ref >60)
Glucose, Bld: 75 mg/dL (ref 65–99)
POTASSIUM: 3.4 mmol/L — AB (ref 3.5–5.1)
SODIUM: 139 mmol/L (ref 135–145)

## 2016-01-28 LAB — GLUCOSE, CAPILLARY: GLUCOSE-CAPILLARY: 83 mg/dL (ref 65–99)

## 2016-01-28 MED ORDER — AMLODIPINE BESYLATE 5 MG PO TABS: 5.0000 mg | ORAL_TABLET | Freq: Every day | ORAL | Status: AC

## 2016-01-28 MED ORDER — POTASSIUM CHLORIDE CRYS ER 20 MEQ PO TBCR
40.0000 meq | EXTENDED_RELEASE_TABLET | Freq: Once | ORAL | Status: AC
  Administered 2016-01-28: 40 meq via ORAL
  Filled 2016-01-28: qty 2

## 2016-01-28 NOTE — Progress Notes (Signed)
Subjective:  No recurrent chest pain or dyspnea. Objective:   Vital Signs : Filed Vitals:   01/28/16 0022 01/28/16 0548 01/28/16 1030 01/28/16 1055  BP: 145/58 142/54 151/56 145/55  Pulse: 65 64 65 63  Temp: 98.9 F (37.2 C) 99.5 F (37.5 C)  97.9 F (36.6 C)  TempSrc: Oral Oral  Oral  Resp: _0 Height:      Weight:  149 lb 8 oz (67.813 kg)    SpO2: 95% 95%  98%    Intake/Output from previous day:  Intake/Output Summary (Last 24 hours) at 01/28/16 1115 Last data filed at 01/28/16 1104  Gross per 24 hour  Intake   1454 ml  Output    875 ml  Net    579 ml    I/O since admission: +959  Wt Readings from Last 3 Encounters:  01/28/16 149 lb 8 oz (67.813 kg)  01/25/16 155 lb (70.308 kg)  10/24/15 155 lb 8 oz (70.534 kg)    Medications: . amLODipine  5 mg Oral Daily  . aspirin EC  81 mg Oral Daily  . atorvastatin  40 mg Oral Daily  . donepezil  5 mg Oral QHS  . finasteride  5 mg Oral Daily  . heparin  5,000 Units Subcutaneous 3 times per day  . isosorbide mononitrate  60 mg Oral Daily  . LORazepam  0.5 mg Intravenous Q4H  . memantine  28 mg Oral Daily  . metoprolol succinate  100 mg Oral Daily  . pantoprazole  40 mg Oral Daily  . sodium chloride flush  3 mL Intravenous Q12H       Physical Exam:   General appearance: cooperative and no distress Neck: no adenopathy, no carotid bruit, no JVD, supple, symmetrical, trachea midline and thyroid not enlarged, symmetric, no tenderness/mass/nodules Lungs: no wheezing; decreased BS at bases Heart: RR paced; 1-2/6 sem; no s3; no rub, no diastolic murmur Abdomen: soft, non-tender; bowel sounds normal; no masses,  no organomegaly Extremities: no edema, redness or tenderness in the calves or thighs Pulses: 2+ and symmetric; good L radial pulse Skin: ecchymosis L forearm  Neurologic: nonfocal;    Rate: 87  Rhythm: AV paced  ECG (independently read by me): AV paced at 68  Lab Results:   Recent Labs  01/26/16 0430 01/27/16 1130 01/28/16 0430  NA 143 142 139  K 3.1* 3.6 3.4*  CL 107 108 109  CO2 _1 GLUCOSE 90 83 75  BUN _2 CREATININE 0.91 0.96 1.01  CALCIUM 9.0 8.9 8.7*    Hepatic Function Latest Ref Rng 01/25/2016 09/04/2014 02/04/2013  Total Protein 6.5 - 8.1 g/dL 6.7 6.7 6.4  Albumin 3.5 - 5.0 g/dL 3.9 3.5 3.4  AST 15 - 41 U/L _3 ALT 17 - 63 U/L _4 Alk Phosphatase 38 - 126 U/L 90 102 101  Total Bilirubin 0.3 - 1.2 mg/dL 1.6(H) 0.9 0.9     Recent Labs  01/25/16 1453 01/25/16 2332 01/26/16 0430  WBC 5.1 5.4 6.1  HGB 13.5 13.0 13.2  HCT 38.7* 37.6* 37.3*  MCV 91.4 93.5 91.6  PLT 138* 137* 129*     Recent Labs  01/25/16 2332 01/26/16 0430 01/26/16 1140  TROPONINI 0.06* 0.04* 0.03    Lab Results  Component Value Date   TSH 1.764 01/25/2016    Recent Labs  01/25/16 2332  HGBA1C 5.2     Recent Labs  01/25/16  1653  PROT 6.7  ALBUMIN 3.9  AST 21  ALT 17  ALKPHOS 90  BILITOT 1.6*    Recent Labs  01/25/16 1653  INR 0.97   BNP (last 3 results)  Recent Labs  01/25/16 1453  BNP 89.0    ProBNP (last 3 results) No results for input(s): PROBNP in the last 8760 hours.   Lipid Panel     Component Value Date/Time   CHOL 106 01/25/2016 2332   TRIG 148 01/25/2016 2332   HDL 26* 01/25/2016 2332   CHOLHDL 4.1 01/25/2016 2332   VLDL 30 01/25/2016 2332   LDLCALC 50 01/25/2016 2332     Imaging:  No results found. CARDIAC CATH  Conclusion     Prox RCA lesion, 100% stenosed.  LIMA .  Sequential SVG .  Prox Graft lesion, before 1st Mrg, 100% stenosed.  SVG .  SVG-PDA is patent without significant stenosis  Prox Graft lesion, 25% stenosed. The lesion was previously treated with a stent (unknown type).  Prox LAD lesion, 75% stenosed.  The left ventricular systolic function is normal.  1. Three vessel CAD with total occlusion of the RCA, severe stenosis of the proximal LAD, and continued patency of  the left circumflex stent 2. S/P CABG with patency of the LIMA-LAD and SVG-R PDA, chronic occlusion of the sequential graft to the first and second OM branches.  3. Mild LV contraction abnormality with preserved overall LVEF  Recommend: continued medical therapy. FU Dr Nehemiah Massed.        Assessment/Plan:   Principal Problem:   Unstable angina (HCC) Active Problems:   Memory difficulty   Alzheimer's disease   Hypertension   MR (mental retardation), moderate   Hypertensive urgency   Coronary artery disease involving native heart with unstable angina pectoris ([place])   Dyslipidemia  1. CAD with unstable angina: Pt is s/p CABG at [facility] in 1987. S/P PCI/stent to SVG to RCA and OM2 in 1/01 and PTCA of OM2 8/2 8/01, with recurrent re-stenosis OM2 with PCI/stent 10/03. Cath yesterday with patent LIMA and SVG to RCA; occluded sequential SVG to LCX with patent stent in OM1 2. Unstable angina; currently pain free 3. HypoK: 3.4; will give Kdur 40 meq x1.  3. H/O MR 4. Permanent AF;  According to son pt was on xarelto but stopped 1 year ago due to urinary bleeding; was just on ASA 81 mg PTA 5. S/P AV pacemaker 6. Emphysema 7. Dementia: on Aricept and Namenda   BP stable; tolerating increased amlodipine dose. K replete. Will dc today and cardiology f/u with Dr. Nehemiah Massed in [place].  Troy Sine, MD, [facility] 01/28/2016, 11:15 AM

## 2016-01-28 NOTE — Care Management Important Message (Signed)
Important Message  Patient Details  Name: Nathan Saunders MRN: [id number] Date of Birth: 1932-12-08   Medicare Important Message Given:  Yes    Maureen Delatte P Rameses Ou 01/28/2016, 2:47 PM

## 2016-01-28 NOTE — Progress Notes (Signed)
Talked to patient's son about DCP, HHC; Son does not want any HHC for his father at this time; CM informed him that if he changed his mind his father's PCP can make arrangements for [facility] services from the office. Abelino Derrick [facility] [phone]

## 2016-01-28 NOTE — Discharge Summary (Signed)
Discharge Summary    Patient ID: Nathan Saunders,  MRN: [id number], DOB/AGE: Dec 31, 1931 82 y.o.  Admit date: 01/25/2016 Discharge date: 01/28/2016  Primary Care Provider: SPARKS,JEFFREY D Primary Cardiologist: Arnoldo Hooker MD [facility])  Discharge Diagnoses    Principal Problem:   Unstable angina [facility]) Active Problems:   Memory difficulty   Alzheimer's disease   Hypertension   MR (mental retardation), moderate   Hypertensive urgency   Coronary artery disease involving native heart with unstable angina pectoris (HCC)   Dyslipidemia   Hypokalemia   Allergies Allergies  Allergen Reactions  . Seroquel [Quetiapine Fumarate] Other (See Comments)    Nightmares  . Serotonin Reuptake Inhibitors (Ssris) Other (See Comments)    Nightmares? Maybe confused for seroquel interaction (not SSRI)    Diagnostic Studies/Procedures    Left Heart Cath and Coronary Angiography 01/26/16    Conclusion     Prox RCA lesion, 100% stenosed.  LIMA .  Sequential SVG .  Prox Graft lesion, before 1st Mrg, 100% stenosed.  SVG .  SVG-PDA is patent without significant stenosis  Prox Graft lesion, 25% stenosed. The lesion was previously treated with a stent (unknown type).  Prox LAD lesion, 75% stenosed.  The left ventricular systolic function is normal.  1. Three vessel CAD with total occlusion of the RCA, severe stenosis of the proximal LAD, and continued patency of the left circumflex stent 2. S/P CABG with patency of the LIMA-LAD and SVG-R PDA, chronic occlusion of the sequential graft to the first and second OM branches.  3. Mild LV contraction abnormality with preserved overall LVEF  Recommend: continued medical therapy. FU Dr Gwen Pounds.   Coronary Findings    Dominance: Right   Left Anterior Descending   . Prox LAD lesion, 75% stenosed. Calcified eccentric.     Left Circumflex   . First Obtuse Marginal Branch   . Ost 1st Mrg to 1st Mrg lesion, 0% stenosed.  Previously placed Ost 1st Mrg to 1st Mrg stent (unknown type) is patent.     Right Coronary Artery   . Prox RCA lesion, 100% stenosed.     Graft Angiography    Free LIMA Graft to Mid LAD  LIMA     Sequential Graft to 1st Mrg, 2nd Mrg  Sequential SVG   . Prox Graft lesion before 1st Mrg, 100% stenosed.     Free Graft to Ost RPDA  SVG SVG-PDA is patent without significant stenosis   . Prox Graft lesion, 25% stenosed. The lesion was previously treated with a stent (unknown type). Mild diffuse ISR          Wall Motion                 Left Heart    Left Ventricle The left ventricular size is normal. The left ventricular systolic function is normal. There are wall motion abnormalities in the left ventricle. There are segmental wall motion abnormalities in the left ventricle. There is mild hypokinesis of the anterolateral wall and apex, LVEF estimated 55%.         History of Present Illness     This is a 82 y.o. male with known history of MI s/p CABG ('87, LIMA to the LAD, saphenous vein graft to OM1 and PDA), HTN, HLD, moderate MR, several PCI, chronic atrial fibrillation (CHB) s/p dual chamber medtronic PPM '04, dementia and chronic angina who presented 01/25/16 with chest pain. Patient was walking [place] [employer] when he started having chest pains  and then took 2 x NTG w/o much relief. The son was accompanying the patient. They called EMS and went to [facility] from where he was transferred to [place]. He had nitropaste placed and that improved the pain.   He describes the pain as substernal in location, heavy in character, associated with exertion and relieves with rest or NTG. This happens almost every day. He is on several antianginal medications. He sometimes has to use NTG patch. His EKG was significantly different because of which a possible STEMI was called. However his pain improved prior to reaching [facility]. It was 8/10 in intensity, radiated across the chest  bilaterally. It was associated with SOB but not diaphoresis.   Of note, patient was seen by Dr. Gwen Pounds on 12/10/2015 and had a stress test which was negative for ischemia. For his angina he is on ranexa 500 mg ER, imdur 60 mg daily which was recently increased to twice daily, amlodipine 2.5 mg and toprol XL 100 mg.   According to son pt was on xarelto for PAF but stopped 1 year ago due to urinary bleeding; was just on ASA 81 mg.   Hospital Course     Consultants: None  The patient was admitted and started on nitro drip, aspirin, plavix and high dose statin. His BP was elevated during admission which improved on nitro drip. Trop was flat trend with minimal elevation. Recommended definitive cardiac catheterization which showed patent LIMA and SVG to RCA; occluded sequential SVG to LCX with patent stent in OM1--> Mild LV contraction abnormality with preserved overall LVEF. Recommended medical therapy. Increased amlodipine to  qd for elevated BP. His pain resolved and ambulated well. Suppliment given for hypokalemia. HgbA1c of 5.2. TSH normal.  01/25/2016: Cholesterol 106; HDL 26*; LDL Cholesterol 50; Triglycerides 148; VLDL 30  The patient has been seen by Dr. Tresa Endo  today and deemed ready for discharge home. All follow-up appointments have been scheduled. Discharge medications are listed below.   Cardiology f/u with Dr. Gwen Pounds in [place]. His Norvasc increased to  qd during admission. No other change in medications.   Discharge Vitals Blood pressure 145/55, pulse 63, temperature 97.9 F (36.6 C), temperature source Oral, resp. rate 16, height  (1.727 m), weight 149 lb 8 oz (67.813 kg), SpO2 98 %.  Filed Weights   01/26/16 0315 01/27/16 0330 01/28/16 0548  Weight: 151 lb 10.8 oz (68.8 kg) 151 lb 7.3 oz (68.7 kg) 149 lb 8 oz (67.813 kg)    Labs & Radiologic Studies     CBC  Recent Labs  01/25/16 2332 01/26/16 0430  WBC 5.4 6.1  HGB 13.0 13.2  HCT 37.6* 37.3*  MCV  93.5 91.6  PLT 137* 129*   Basic Metabolic Panel  Recent Labs  01/27/16 1130 01/28/16 0430  NA 142 139  K 3.6 3.4*  CL 108 109  CO2 27 22  GLUCOSE 83 75  BUN 14 16  CREATININE 0.96 1.01  CALCIUM 8.9 8.7*  Cardiac Enzymes  Recent Labs  01/25/16 2332 01/26/16 0430 01/26/16 1140  TROPONINI 0.06* 0.04* 0.03  Hemoglobin A1C  Recent Labs  01/25/16 2332  HGBA1C 5.2   Fasting Lipid Panel  Recent Labs  01/25/16 2332  CHOL 106  HDL 26*  LDLCALC 50  TRIG 161  CHOLHDL 4.1   Thyroid Function Tests  Recent Labs  01/25/16 2332  TSH 1.764    Dg Chest 2 View  01/25/2016  CLINICAL DATA:  Chest pain EXAM: CHEST  2 VIEW COMPARISON:  09/04/2014 FINDINGS: Prior CABG. Dual lead pacer in place. Atherosclerotic aortic arch. Severe emphysema. Biapical pleural parenchymal scarring. Symmetric nipple shadows noted. Coronary atherosclerosis. Bony demineralization. No pleural effusion or airspace opacity. IMPRESSION: 1. Severe emphysema. 2. Prior CABG. Heart size currently normal; no edema or pleural effusion. Electronically Signed   By: Gaylyn Rong M.D.   On: 01/25/2016 15:32    Disposition   Pt is being discharged home today in good condition.  Follow-up Plans & Appointments    Follow-up Information    Follow up with Lamar Blinks, MD. Go on 02/11/2016.   Specialty:  Internal Medicine   Why:  for post hospital @3 :15pm   Contact information:   [address] [facility] [place] [address] [phone]      Discharge Instructions    Call MD for:  redness, tenderness, or signs of infection (pain, swelling, redness, odor or green/yellow discharge around incision site)    Complete by:  As directed      Diet - low sodium heart healthy    Complete by:  As directed      Discharge instructions    Complete by:  As directed   No driving for 48 hours. No lifting over 5 lbs for 1 week. No sexual activity for 1 week.  Keep procedure site  clean & dry. If you notice increased pain, swelling, bleeding or pus, call/return!  You may shower, but no soaking baths/hot tubs/pools for 1 week.     Increase activity slowly    Complete by:  As directed            Discharge Medications   Discharge Medication List as of 01/28/2016 11:44 AM    CONTINUE these medications which have CHANGED   Details  amLODipine (NORVASC) 5 MG tablet Take 1 tablet (5 mg total) by mouth daily., Starting 01/28/2016, Until Discontinued, Normal      CONTINUE these medications which have NOT CHANGED   Details  aspirin 81 MG tablet Take 81 mg by mouth daily., Until Discontinued, Historical Med    atorvastatin (LIPITOR) 40 MG tablet Take 40 mg by mouth daily., Until Discontinued, Historical Med    donepezil (ARICEPT) 5 MG tablet Take 1 tablet (5 mg total) by mouth at bedtime., Starting 10/24/2015, Until Discontinued, Normal    finasteride (PROSCAR) 5 MG tablet Take 5 mg by mouth daily., Starting 09/18/2014, Until Discontinued, Historical Med    isosorbide mononitrate (IMDUR) 60 MG 24 hr tablet Take 60 mg by mouth daily., Starting 09/24/2014, Until Discontinued, Historical Med    meloxicam (MOBIC) 7.5 MG tablet Take 7.5 mg by mouth daily., Starting 06/14/2014, Until Discontinued, Historical Med    metoprolol succinate (TOPROL-XL) 100 MG 24 hr tablet Take 100 mg by mouth daily., Starting 09/03/2014, Until Discontinued, Historical Med    NAMENDA XR 28 MG CP24 24 hr capsule Take 28 mg by mouth daily., Starting 04/01/2015, Until Discontinued, Historical Med    nitroGLYCERIN (NITRODUR - DOSED IN MG/24 HR) 0.1 mg/hr patch Place 0.1 mg onto the skin daily as needed (for angina). , Starting 09/06/2014, Until Discontinued, Historical Med    pantoprazole (PROTONIX) 40 MG tablet Take 40 mg by mouth daily., Starting 09/06/2014, Until Discontinued, Historical Med          Outstanding Labs/Studies   None  Duration of Discharge Encounter   Greater than 30 minutes  including physician time.  Signed, Azha Constantin PA-C 01/28/2016, 5:40 PM

## 2016-01-28 NOTE — Progress Notes (Signed)
Orders received for pt discharge.  K+ 40 mEq PO given prior to discharge as ordered.  Discharge summary printed and reviewed with pt's son.  Explained medication regimen, and pt had no further questions at this time.  IV removed and site remains clean, dry, intact.  Telemetry removed.  Pt in stable condition and awaiting transport.

## 2016-02-09 DIAGNOSIS — E538 Deficiency of other specified B group vitamins: Secondary | ICD-10-CM | POA: Diagnosis not present

## 2016-02-09 DIAGNOSIS — G629 Polyneuropathy, unspecified: Secondary | ICD-10-CM | POA: Diagnosis not present

## 2016-02-09 DIAGNOSIS — I495 Sick sinus syndrome: Secondary | ICD-10-CM | POA: Diagnosis not present

## 2016-02-09 DIAGNOSIS — E782 Mixed hyperlipidemia: Secondary | ICD-10-CM | POA: Diagnosis not present

## 2016-02-09 DIAGNOSIS — Z79899 Other long term (current) drug therapy: Secondary | ICD-10-CM | POA: Diagnosis not present

## 2016-02-09 DIAGNOSIS — I482 Chronic atrial fibrillation: Secondary | ICD-10-CM | POA: Diagnosis not present

## 2016-02-09 DIAGNOSIS — I10 Essential (primary) hypertension: Secondary | ICD-10-CM | POA: Diagnosis not present

## 2016-02-11 DIAGNOSIS — R0602 Shortness of breath: Secondary | ICD-10-CM | POA: Diagnosis not present

## 2016-02-11 DIAGNOSIS — E782 Mixed hyperlipidemia: Secondary | ICD-10-CM | POA: Diagnosis not present

## 2016-02-11 DIAGNOSIS — I214 Non-ST elevation (NSTEMI) myocardial infarction: Secondary | ICD-10-CM | POA: Diagnosis not present

## 2016-02-11 DIAGNOSIS — I25708 Atherosclerosis of coronary artery bypass graft(s), unspecified, with other forms of angina pectoris: Secondary | ICD-10-CM | POA: Diagnosis not present

## 2016-02-11 DIAGNOSIS — I495 Sick sinus syndrome: Secondary | ICD-10-CM | POA: Diagnosis not present

## 2016-02-11 DIAGNOSIS — I10 Essential (primary) hypertension: Secondary | ICD-10-CM | POA: Diagnosis not present

## 2016-02-11 DIAGNOSIS — I255 Ischemic cardiomyopathy: Secondary | ICD-10-CM | POA: Diagnosis not present

## 2016-02-11 DIAGNOSIS — I482 Chronic atrial fibrillation: Secondary | ICD-10-CM | POA: Diagnosis not present

## 2016-02-11 DIAGNOSIS — I340 Nonrheumatic mitral (valve) insufficiency: Secondary | ICD-10-CM | POA: Diagnosis not present

## 2016-04-07 DIAGNOSIS — I25708 Atherosclerosis of coronary artery bypass graft(s), unspecified, with other forms of angina pectoris: Secondary | ICD-10-CM | POA: Diagnosis not present

## 2016-04-07 DIAGNOSIS — I214 Non-ST elevation (NSTEMI) myocardial infarction: Secondary | ICD-10-CM | POA: Diagnosis not present

## 2016-04-07 DIAGNOSIS — I495 Sick sinus syndrome: Secondary | ICD-10-CM | POA: Diagnosis not present

## 2016-04-07 DIAGNOSIS — I482 Chronic atrial fibrillation: Secondary | ICD-10-CM | POA: Diagnosis not present

## 2016-04-07 DIAGNOSIS — E782 Mixed hyperlipidemia: Secondary | ICD-10-CM | POA: Diagnosis not present

## 2016-04-19 ENCOUNTER — Other Ambulatory Visit: Payer: Self-pay | Admitting: Physician Assistant

## 2016-04-20 NOTE — Telephone Encounter (Signed)
Just wanted to clarify, should this be deferred to Arnoldo HookerBruce Kowalski MD? Please advise. Thanks, MI

## 2016-04-20 NOTE — Telephone Encounter (Signed)
Yes please send to pt's cardiologist.

## 2016-05-04 DIAGNOSIS — I442 Atrioventricular block, complete: Secondary | ICD-10-CM | POA: Diagnosis not present

## 2016-05-04 DIAGNOSIS — I10 Essential (primary) hypertension: Secondary | ICD-10-CM | POA: Diagnosis not present

## 2016-05-04 DIAGNOSIS — I482 Chronic atrial fibrillation: Secondary | ICD-10-CM | POA: Diagnosis not present

## 2016-05-21 ENCOUNTER — Other Ambulatory Visit: Payer: Self-pay | Admitting: Neurology

## 2016-05-25 DIAGNOSIS — J40 Bronchitis, not specified as acute or chronic: Secondary | ICD-10-CM | POA: Diagnosis not present

## 2016-06-23 ENCOUNTER — Ambulatory Visit: Payer: Commercial Managed Care - HMO | Admitting: Adult Health

## 2016-08-04 ENCOUNTER — Encounter: Payer: Self-pay | Admitting: Adult Health

## 2016-08-04 ENCOUNTER — Ambulatory Visit (INDEPENDENT_AMBULATORY_CARE_PROVIDER_SITE_OTHER): Payer: Commercial Managed Care - HMO | Admitting: Adult Health

## 2016-08-04 VITALS — BP 146/74 | HR 84 | Ht 68.0 in | Wt 152.2 lb

## 2016-08-04 DIAGNOSIS — F0390 Unspecified dementia without behavioral disturbance: Secondary | ICD-10-CM

## 2016-08-04 NOTE — Progress Notes (Signed)
PATIENT: NYSIR FERGUSSON DOB: 1932-09-13  REASON FOR VISIT: follow up- memory HISTORY FROM: patient  HISTORY OF PRESENT ILLNESS: Mr. Waddington is an 82 year old male with a history of progressive memory disturbance. He returns today for follow-up. The patient is able to complete all ADLs independently. He does need assistance with his medication and appointments. He no longer completes his finances. His son is helping him with this. He lives with his son now. He does not operate a motor vehicle. Sleeping well at night. Good appetite. Denies any hallucinations. No change in behavior. He remains on Namenda and Aricept. He is tolerating both of these medications well. He returns today for an evaluation.  HISTORY 10/24/15: Mr. Blatz is an 82 year old left-handed white male with a history of a progressive memory disturbance. The patient lives with his son, he is no longer operating motor vehicle. The patient is requiring assistance with keeping up with medications, appointments, and with the finances. The patient is able to otherwise perform his own activities of daily living. The patient is sleeping well at night, no agitation or hallucinations have been noted. The son believes that his cognitive functioning level has been maintained since last seen. The patient is on Namenda and low-dose Aricept. He is having some issues with indigestion, reflux problems. He is on Protonix. The patient returns to this office for an evaluation.  REVIEW OF SYSTEMS: Out of a complete 14 system review of symptoms, the patient complains only of the following symptoms, and all other reviewed systems are negative.  Behavior problem, confusion, chest pain, memory loss  ALLERGIES: Allergies  Allergen Reactions  . Seroquel [Quetiapine Fumarate] Other (See Comments)    Nightmares  . Serotonin Reuptake Inhibitors (Ssris) Other (See Comments)    Nightmares? Maybe confused for seroquel interaction (not SSRI)    HOME  MEDICATIONS: Outpatient Medications Prior to Visit  Medication Sig Dispense Refill  . amLODipine (NORVASC) 5 MG tablet Take 1 tablet (5 mg total) by mouth daily. 30 tablet 2  . aspirin 81 MG tablet Take 81 mg by mouth daily.    Marland Kitchen atorvastatin (LIPITOR) 40 MG tablet Take 40 mg by mouth daily.    Marland Kitchen donepezil (ARICEPT) 5 MG tablet Take 1 tablet (5 mg total) by mouth at bedtime. 90 tablet 0  . finasteride (PROSCAR) 5 MG tablet Take 5 mg by mouth daily.    . isosorbide mononitrate (IMDUR) 60 MG 24 hr tablet Take 60 mg by mouth daily.    . meloxicam (MOBIC) 7.5 MG tablet Take 7.5 mg by mouth daily.    . metoprolol succinate (TOPROL-XL) 100 MG 24 hr tablet Take 100 mg by mouth daily.    Marland Kitchen NAMENDA XR 28 MG CP24 24 hr capsule Take 28 mg by mouth daily.    . nitroGLYCERIN (NITRODUR - DOSED IN MG/24 HR) 0.1 mg/hr patch Place 0.1 mg onto the skin daily as needed (for angina).     . pantoprazole (PROTONIX) 40 MG tablet Take 40 mg by mouth daily.     No facility-administered medications prior to visit.     PAST MEDICAL HISTORY: Past Medical History:  Diagnosis Date  . AICD (automatic cardioverter/defibrillator) present   . Alzheimer's disease 04/18/2015  . Anemia of chronic disease   . Atherosclerotic heart disease 1987   Multivessel disease, referred for CABG  (Cardilogist - Dr. Gwen Pounds, [facility])  . Benign enlargement of prostate   . Cervical spondylosis   . Cervicogenic headache   . Cervicogenic headache   .  Chronic atrial fibrillation (HCC)   . 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 PCI to [facility] and SVG OM and OM in Jan '01; PTCA of OM 2 ISR in Aug '02; recurrent ISR in OM 2 - PCI with Cypher DES in Oct '03; d. SVG-OM occluded - PCI to mCx & OM2 with Promus 3.0 mm x 8 mm stents; 12/'16 -Cardiolite - Negative  . Dyslipidemia   . Falls   . Gait disturbance   . History of complete heart block  September 2004   Dual-chamber put pacemaker placed [facility])  . Hypertension   . Insomnia   . Ischemic cardiomyopathy    (EF 55% by Cardiolite in 11/2015) - but Echo in 11/2015: EF ~40% with apical akinesis and global hypokinesis, moderate LA dilation, mild RA dilation  . Memory disturbance 09/24/2014   progressive; vascular dementia  . S/P CABG x 3 1987   LIMA-LAD, SVG-OM 2, SVG RCA    PAST SURGICAL HISTORY: Past Surgical History:  Procedure Laterality Date  . CARDIAC CATHETERIZATION N/A 01/26/2016   Procedure: Left Heart Cath and Coronary Angiography;  Surgeon: Tonny Bollman, MD;  Location: [facility] INVASIVE CV LAB;  Service: Cardiovascular;  Laterality: N/A;  . CHOLECYSTECTOMY  2013   . CORONARY ARTERY BYPASS GRAFT     [facility]: LIMA-LAD, SVG OM and OM 2, SVG-PDA  . CORONARY STENT PLACEMENT  Jan 2001, Aug 2002, Oct 2003; 8/06   a. 1/01 -BMS to SVG-RCA and SVG-OM 2; b. ISR in SVG-OM2 PTCA; c. Re-ISR SVG-OM2 --> Cypher DES;; d. SVG-OM occluded - PCI to mCx & OM2 with Promus 3.0 mm x 8 mm stents  . hernia repair Bilateral   . NM MYOVIEW LTD  December 2016   [facility]: Negative Cardiolite, normal wall motion =- EF ~55%  . PACEMAKER PLACEMENT  September 2004   Dual-chamber, ([facility]/ [place]) - Dr. Darrold Junker  . TRANSTHORACIC ECHOCARDIOGRAM  December 2016    [facility]: EF ~40% with apical akinesis and global hypokinesis, moderate LA dilation, mild RA dilation  . TRANSURETHRAL RESECTION OF PROSTATE      FAMILY HISTORY: Family History  Problem Relation Age of Onset  . Dementia Neg Hx   . Heart disease Brother   . Heart attack Mother   . Heart attack Father   . Lung cancer Brother   . Lung cancer Sister     SOCIAL HISTORY: Social History   Social History  . Marital status: Widowed    Spouse name: N/A  . Number of children: 1  . Years of education: hs   Occupational History  . Retired    Social History Main Topics  . Smoking status: Former Smoker    Packs/day: 0.50     Years: 30.00    Types: Cigarettes  . Smokeless tobacco: Never Used  . Alcohol use No  . Drug use: No  . Sexual activity: Not on file   Other Topics Concern  . Not on file   Social History Narrative   Patient is left handed.   Patient drinks 3 cups of caffeine daily.      PHYSICAL EXAM  Vitals:   08/04/16 1506  BP: (!) 146/74  Pulse: 84  Weight: 152 lb 3.2 oz (69 kg)  Height: 5\' 8"  (1.727 m)   Body mass index is 23.14 kg/m.   MMSE - Mini Mental State Exam 08/04/2016 10/24/2015 04/18/2015  Orientation to time 0  1 1  Orientation to Place 3 2 2   Registration 3 0 2  Attention/ Calculation 0 0 2  Recall 0 0 0  Language- name 2 objects 1 2 2   Language- repeat 0 1 1  Language- follow 3 step command 3 3 3   Language- read & follow direction 1 1 1   Write a sentence 1 1 1   Copy design 0 0 0  Total score 12 11 15     Generalized: Well developed, in no acute distress   Neurological examination  Mentation: Alert oriented to time, place, history taking. Follows all commands speech and language fluent Cranial nerve II-XII: Pupils were equal round reactive to light. Extraocular movements were full, visual field were full on confrontational test. Facial sensation and strength were normal. Uvula tongue midline. Head turning and shoulder shrug  were normal and symmetric. Motor: The motor testing reveals 5 over 5 strength of all 4 extremities. Good symmetric motor tone is noted throughout.  Sensory: Sensory testing is intact to soft touch on all 4 extremities. No evidence of extinction is noted.  Coordination: Cerebellar testing reveals good finger-nose-finger and heel-to-shin bilaterally.  Gait and station: Gait is normal. Tandem gait is normal. Romberg is negative. No drift is seen.  Reflexes: Deep tendon reflexes are symmetric and normal bilaterally.   DIAGNOSTIC DATA (LABS, IMAGING, TESTING) - I reviewed patient records, labs, notes, testing and imaging myself where  available.  Lab Results  Component Value Date   WBC 6.1 01/26/2016   HGB 13.2 01/26/2016   HCT 37.3 (L) 01/26/2016   MCV 91.6 01/26/2016   PLT 129 (L) 01/26/2016      Component Value Date/Time   NA 139 01/28/2016 0430   NA 139 09/06/2014 0459   K 3.4 (L) 01/28/2016 0430   K 4.3 09/06/2014 0459   CL 109 01/28/2016 0430   CL 103 09/06/2014 0459   CO2 22 01/28/2016 0430   CO2 29 09/06/2014 0459   GLUCOSE 75 01/28/2016 0430   GLUCOSE 97 09/06/2014 0459   BUN 16 01/28/2016 0430   BUN 17 09/06/2014 0459   CREATININE 1.01 01/28/2016 0430   CREATININE 1.19 09/06/2014 0459   CALCIUM 8.7 (L) 01/28/2016 0430   CALCIUM 8.6 09/06/2014 0459   PROT 6.7 01/25/2016 1653   PROT 6.7 09/04/2014 0940   ALBUMIN 3.9 01/25/2016 1653   ALBUMIN 3.5 09/04/2014 0940   AST 21 01/25/2016 1653   AST 16 09/04/2014 0940   ALT 17 01/25/2016 1653   ALT 27 09/04/2014 0940   ALKPHOS 90 01/25/2016 1653   ALKPHOS 102 09/04/2014 0940   BILITOT 1.6 (H) 01/25/2016 1653   BILITOT 0.9 09/04/2014 0940   GFRNONAA >60 01/28/2016 0430   GFRNONAA 57 (L) 09/06/2014 0459   GFRAA >60 01/28/2016 0430   GFRAA >60 09/06/2014 0459   Lab Results  Component Value Date   CHOL 106 01/25/2016   HDL 26 (L) 01/25/2016   LDLCALC 50 01/25/2016   TRIG 148 01/25/2016   CHOLHDL 4.1 01/25/2016   Lab Results  Component Value Date   HGBA1C 5.2 01/25/2016    Lab Results  Component Value Date   TSH 1.764 01/25/2016      ASSESSMENT AND PLAN 82 y.o. year old male  has a past medical history of AICD (automatic cardioverter/defibrillator) present; Alzheimer's disease (04/18/2015); Anemia of chronic disease; Atherosclerotic heart disease (1987); Benign enlargement of prostate; Cervical spondylosis; Cervicogenic headache; Cervicogenic headache; Chronic atrial fibrillation (HCC); Coronary artery disease involving autologous vein coronary bypass  graft with angina pectoris (HCC) (2001, 2002, 2003; 2006); Dyslipidemia; Falls; Gait  disturbance; History of complete heart block (September 2004); Hypertension; Insomnia; Ischemic cardiomyopathy; Memory disturbance (09/24/2014); and S/P CABG x 3 (1987). here with:  1. Memory disturbance  The patient will continue on Aricept and Namenda. Memory score has remained stable. Advised that if his symptoms worsen or he develops any new symptoms he should let us know. Will follow-up in 6 months or sooner if needed.   Butch PennyMegan Chrstopher Malenfant, MSN, NP-C 08/04/2016, 3:09 PM [facility] [address], [address] [place], [place] [address] [phone]

## 2016-08-04 NOTE — Progress Notes (Signed)
I have read the note, and I agree with the clinical assessment and plan.  WILLIS,CHARLES KEITH   

## 2016-08-04 NOTE — Patient Instructions (Signed)
Continue Aricept and Namenda Memory score stable If your symptoms worsen or you develop new symptoms please let us know.

## 2016-08-11 DIAGNOSIS — I25708 Atherosclerosis of coronary artery bypass graft(s), unspecified, with other forms of angina pectoris: Secondary | ICD-10-CM | POA: Diagnosis not present

## 2016-08-11 DIAGNOSIS — I482 Chronic atrial fibrillation: Secondary | ICD-10-CM | POA: Diagnosis not present

## 2016-08-11 DIAGNOSIS — I495 Sick sinus syndrome: Secondary | ICD-10-CM | POA: Diagnosis not present

## 2016-08-11 DIAGNOSIS — I214 Non-ST elevation (NSTEMI) myocardial infarction: Secondary | ICD-10-CM | POA: Diagnosis not present

## 2016-08-18 ENCOUNTER — Other Ambulatory Visit: Payer: Self-pay | Admitting: Neurology

## 2016-09-03 DIAGNOSIS — F0150 Vascular dementia without behavioral disturbance: Secondary | ICD-10-CM | POA: Diagnosis not present

## 2016-09-03 DIAGNOSIS — R0602 Shortness of breath: Secondary | ICD-10-CM | POA: Diagnosis not present

## 2016-09-03 DIAGNOSIS — I482 Chronic atrial fibrillation: Secondary | ICD-10-CM | POA: Diagnosis not present

## 2016-09-03 DIAGNOSIS — E782 Mixed hyperlipidemia: Secondary | ICD-10-CM | POA: Diagnosis not present

## 2016-09-03 DIAGNOSIS — E538 Deficiency of other specified B group vitamins: Secondary | ICD-10-CM | POA: Diagnosis not present

## 2016-09-03 DIAGNOSIS — I10 Essential (primary) hypertension: Secondary | ICD-10-CM | POA: Diagnosis not present

## 2016-09-03 DIAGNOSIS — Z79899 Other long term (current) drug therapy: Secondary | ICD-10-CM | POA: Diagnosis not present

## 2016-09-08 DIAGNOSIS — E538 Deficiency of other specified B group vitamins: Secondary | ICD-10-CM | POA: Diagnosis not present

## 2016-09-15 DIAGNOSIS — E538 Deficiency of other specified B group vitamins: Secondary | ICD-10-CM | POA: Diagnosis not present

## 2016-09-22 DIAGNOSIS — E538 Deficiency of other specified B group vitamins: Secondary | ICD-10-CM | POA: Diagnosis not present

## 2016-09-29 DIAGNOSIS — E538 Deficiency of other specified B group vitamins: Secondary | ICD-10-CM | POA: Diagnosis not present

## 2016-11-04 DIAGNOSIS — I442 Atrioventricular block, complete: Secondary | ICD-10-CM | POA: Diagnosis not present

## 2016-11-05 DIAGNOSIS — L84 Corns and callosities: Secondary | ICD-10-CM | POA: Diagnosis not present

## 2016-11-14 ENCOUNTER — Emergency Department
Admission: EM | Admit: 2016-11-14 | Discharge: 2016-11-14 | Disposition: A | Discharge status: Home / Self Care | Payer: Commercial Managed Care - HMO | Attending: Emergency Medicine | Admitting: Emergency Medicine

## 2016-11-14 ENCOUNTER — Emergency Department: Payer: Commercial Managed Care - HMO

## 2016-11-14 DIAGNOSIS — G309 Alzheimer's disease, unspecified: Secondary | ICD-10-CM | POA: Insufficient documentation

## 2016-11-14 DIAGNOSIS — I10 Essential (primary) hypertension: Secondary | ICD-10-CM | POA: Insufficient documentation

## 2016-11-14 DIAGNOSIS — Z87891 Personal history of nicotine dependence: Secondary | ICD-10-CM | POA: Diagnosis not present

## 2016-11-14 DIAGNOSIS — R55 Syncope and collapse: Secondary | ICD-10-CM | POA: Diagnosis not present

## 2016-11-14 DIAGNOSIS — Z7982 Long term (current) use of aspirin: Secondary | ICD-10-CM | POA: Insufficient documentation

## 2016-11-14 DIAGNOSIS — R404 Transient alteration of awareness: Secondary | ICD-10-CM | POA: Diagnosis not present

## 2016-11-14 DIAGNOSIS — R531 Weakness: Secondary | ICD-10-CM | POA: Insufficient documentation

## 2016-11-14 DIAGNOSIS — I2510 Atherosclerotic heart disease of native coronary artery without angina pectoris: Secondary | ICD-10-CM | POA: Insufficient documentation

## 2016-11-14 DIAGNOSIS — Z79899 Other long term (current) drug therapy: Secondary | ICD-10-CM | POA: Insufficient documentation

## 2016-11-14 DIAGNOSIS — R110 Nausea: Secondary | ICD-10-CM | POA: Diagnosis not present

## 2016-11-14 LAB — CBC
HCT: 43.6 % (ref 40.0–52.0)
Hemoglobin: 15.4 g/dL (ref 13.0–18.0)
MCH: 32.5 pg (ref 26.0–34.0)
MCHC: 35.4 g/dL (ref 32.0–36.0)
MCV: 91.9 fL (ref 80.0–100.0)
PLATELETS: 147 10*3/uL — AB (ref 150–440)
RBC: 4.74 MIL/uL (ref 4.40–5.90)
RDW: 13.5 % (ref 11.5–14.5)
WBC: 6.3 10*3/uL (ref 3.8–10.6)

## 2016-11-14 LAB — BASIC METABOLIC PANEL
ANION GAP: 8 (ref 5–15)
BUN: 25 mg/dL — ABNORMAL HIGH (ref 6–20)
CALCIUM: 9.1 mg/dL (ref 8.9–10.3)
CO2: 28 mmol/L (ref 22–32)
CREATININE: 1.19 mg/dL (ref 0.61–1.24)
Chloride: 105 mmol/L (ref 101–111)
GFR calc Af Amer: >60 mL/min (ref >60)
GFR, EST NON AFRICAN AMERICAN: 55 mL/min — AB (ref >60)
GLUCOSE: 84 mg/dL (ref 65–99)
Potassium: 3.8 mmol/L (ref 3.5–5.1)
Sodium: 141 mmol/L (ref 135–145)

## 2016-11-14 LAB — TROPONIN I
Troponin I: <0.03 ng/mL (ref <0.03)
Troponin I: <0.03 ng/mL (ref <0.03)

## 2016-11-14 LAB — URINALYSIS COMPLETE WITH MICROSCOPIC (ARMC ONLY)
BILIRUBIN URINE: NEGATIVE
GLUCOSE, UA: NEGATIVE mg/dL
Leukocytes, UA: NEGATIVE
NITRITE: NEGATIVE
PROTEIN: NEGATIVE mg/dL
SPECIFIC GRAVITY, URINE: 1.011 (ref 1.005–1.030)
Squamous Epithelial / LPF: NONE SEEN
pH: 6 (ref 5.0–8.0)

## 2016-11-14 NOTE — ED Provider Notes (Signed)
[facility] Emergency Department Provider Note   ____________________________________________   I have reviewed the triage vital signs and the nursing notes.   HISTORY  Chief Complaint Weakness   History limited by: Dementia   HPI Nathan Saunders is a 82 y.o. male who presents to the emergency department today via EMS after a syncopal episode in church today. The history is primarily obtained from the son. The son states that for the past week the patient has seemed slightly more confused. He was able however to coax him out of the house this morning to go to church. As they were leaving church the patient became unresponsive. The patient cannot remember any of this. He does state however he feels "sick" although cannot really explain what that means. The patient states he did have some chest pain and headache. Per son he has chronic angina. No recent fevers.    Past Medical History:  Diagnosis Date  . AICD (automatic cardioverter/defibrillator) present   . Alzheimer's disease 04/18/2015  . Anemia of chronic disease   . Atherosclerotic heart disease 1987   Multivessel disease, referred for CABG @[facility]  ([facility]  . Benign enlargement of prostate   . Cervical spondylosis   . Cervicogenic headache   . Cervicogenic headache   . Chronic atrial fibrillation (HCC)   . 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 and OM in Jan '01; PTCA of OM 2 ISR in Aug '02; recurrent ISR in OM 2 - PCI with Cypher DES in Oct '03; d. SVG-OM occluded - PCI to mCx & OM2 with Promus 3.0 mm x 8 mm stents; 12/'16 -Cardiolite - Negative  . Dyslipidemia   . Falls   . Gait disturbance   . History of complete heart block September 2004   Dual-chamber put pacemaker placed [facility])  . Hypertension   . Insomnia   . Ischemic cardiomyopathy    (EF  55% by Cardiolite in 11/2015) - but Echo in 11/2015: EF ~40% with apical akinesis and global hypokinesis, moderate LA dilation, mild RA dilation  . Memory disturbance 09/24/2014   progressive; vascular dementia  . S/P CABG x 3 1987   LIMA-LAD, SVG-OM 2, SVG RCA    Patient Active Problem List   Diagnosis Date Noted  . Unstable angina (HCC) 01/25/2016  . Hypertension 01/25/2016  . MR (mental retardation), moderate 01/25/2016  . Hypertensive urgency 01/25/2016  . Coronary artery disease involving native heart with unstable angina pectoris (HCC) 01/25/2016  . Dyslipidemia 01/25/2016  . Alzheimer's disease 04/18/2015  . Memory difficulty 09/24/2014    Past Surgical History:  Procedure Laterality Date  . CARDIAC CATHETERIZATION N/A 01/26/2016   Procedure: Left Heart Cath and Coronary Angiography;  Surgeon: Tonny BollmanMichael Cooper, MD;  [facility];  Service: Cardiovascular;  Laterality: N/A;  . CHOLECYSTECTOMY  2013   . CORONARY ARTERY BYPASS GRAFT     [facility]: LIMA-LAD, SVG OM and OM 2, SVG-PDA  . CORONARY STENT PLACEMENT  Jan 2001, Aug 2002, Oct 2003; 8/06   a. 1/01 -BMS to SVG-RCA and SVG-OM 2; b. ISR in SVG-OM2 PTCA; c. Re-ISR SVG-OM2 --> Cypher DES;; d. SVG-OM occluded - PCI to mCx & OM2 with Promus 3.0 mm x 8 mm stents  . hernia repair Bilateral   . NM MYOVIEW LTD  December 2016   [facility]: Negative Cardiolite, normal  wall motion =- EF ~55%  . PACEMAKER PLACEMENT  September 2004   Dual-chamber, ([facility]/ [place]) - Dr. Darrold Junker  . TRANSTHORACIC ECHOCARDIOGRAM  December 2016    [facility]: EF ~40% with apical akinesis and global hypokinesis, moderate LA dilation, mild RA dilation  . TRANSURETHRAL RESECTION OF PROSTATE      Prior to Admission medications   Medication Sig Start Date End Date Taking? Authorizing Provider  amLODipine (NORVASC) 5 MG tablet Take 1 tablet (5 mg total) by mouth daily. 01/28/16   Manson Passey, PA  aspirin 81 MG tablet Take 81 mg by  mouth daily.    Historical Provider, MD  atorvastatin (LIPITOR) 40 MG tablet Take 40 mg by mouth daily.    Historical Provider, MD  donepezil (ARICEPT) 5 MG tablet Take 1 tablet (5 mg total) by mouth at bedtime. 05/25/16   York Spaniel, MD  donepezil (ARICEPT) 5 MG tablet Take 1 tablet (5 mg total) by mouth at bedtime. 08/19/16   York Spaniel, MD  finasteride (PROSCAR) 5 MG tablet Take 5 mg by mouth daily. 09/18/14   Historical Provider, MD  isosorbide mononitrate (IMDUR) 60 MG 24 hr tablet Take 60 mg by mouth daily. 09/24/14   Historical Provider, MD  meloxicam (MOBIC) 7.5 MG tablet Take 7.5 mg by mouth daily. 06/14/14   Historical Provider, MD  metoprolol succinate (TOPROL-XL) 100 MG 24 hr tablet Take 100 mg by mouth daily. 09/03/14   Historical Provider, MD  NAMENDA XR 28 MG CP24 24 hr capsule Take 28 mg by mouth daily. 04/01/15   Historical Provider, MD  nitroGLYCERIN (NITRODUR - DOSED IN MG/24 HR) 0.1 mg/hr patch Place 0.1 mg onto the skin daily as needed (for angina).  09/06/14   Historical Provider, MD  pantoprazole (PROTONIX) 40 MG tablet Take 40 mg by mouth daily. 09/06/14   Historical Provider, MD    Allergies Seroquel [quetiapine fumarate] and Serotonin reuptake inhibitors (ssris)  Family History  Problem Relation Age of Onset  . Heart attack Mother   . Heart attack Father   . Heart disease Brother   . Lung cancer Brother   . Lung cancer Sister   . Dementia Neg Hx     Social History Social History  Substance Use Topics  . Smoking status: Former Smoker    Packs/day: 0.50    Years: 30.00    Types: Cigarettes  . Smokeless tobacco: Never Used  . Alcohol use No    Review of Systems  Constitutional: Negative for fever. Cardiovascular: Positive for chest pain. Respiratory: Negative for shortness of breath. Gastrointestinal: Negative for abdominal pain, vomiting and diarrhea. Genitourinary: Negative for dysuria. Musculoskeletal: Negative for back pain. Skin: Negative for  rash. Neurological: Negative for headaches, focal weakness or numbness.  10-point ROS otherwise negative.  ____________________________________________   PHYSICAL EXAM:  VITAL SIGNS: ED Triage Vitals  Enc Vitals Group     BP 11/14/16 1255 (!) 174/85     Pulse Rate 11/14/16 1255 78     Resp 11/14/16 1255 16     Temp 11/14/16 1255 97.8 F (36.6 C)     Temp Source 11/14/16 1255 Oral     SpO2 11/14/16 1255 95 %     Weight 11/14/16 1253 160 lb (72.6 kg)     Height 11/14/16 1253 5\' 10"  (1.778 m)   Constitutional: Alert and oriented. Well appearing and in no distress. Eyes: Conjunctivae are normal. Normal extraocular movements. ENT   Head: Normocephalic and atraumatic.   Nose:  No congestion/rhinnorhea.   Mouth/Throat: Mucous membranes are moist.   Neck: No stridor. Hematological/Lymphatic/Immunilogical: No cervical lymphadenopathy. Cardiovascular: Normal rate, regular rhythm.  Systolic II/VI murmur. Respiratory: Normal respiratory effort without tachypnea nor retractions. Breath sounds are clear and equal bilaterally. No wheezes/rales/rhonchi. Gastrointestinal: Soft and nontender. No distention.  Genitourinary: Deferred Musculoskeletal: Normal range of motion in all extremities. No lower extremity edema. Neurologic:  Normal speech and language. No gross focal neurologic deficits are appreciated.  Skin:  Skin is warm, dry and intact. No rash noted. Psychiatric: Mood and affect are normal. Speech and behavior are normal. Patient exhibits appropriate insight and judgment.  ____________________________________________    LABS (pertinent positives/negatives)  Labs Reviewed  BASIC METABOLIC PANEL - Abnormal; Notable for the following:       Result Value   BUN 25 (*)    GFR calc non Af Amer 55 (*)    All other components within normal limits  CBC - Abnormal; Notable for the following:    Platelets 147 (*)    All other components within normal limits  URINALYSIS  COMPLETEWITH MICROSCOPIC ([facility] ONLY) - Abnormal; Notable for the following:    Color, Urine YELLOW (*)    APPearance CLEAR (*)    Ketones, ur TRACE (*)    Hgb urine dipstick 1+ (*)    Bacteria, UA RARE (*)    All other components within normal limits  TROPONIN I  TROPONIN I     ____________________________________________   EKG  I, Phineas SemenGraydon Jazyah Butsch, attending physician, personally viewed and interpreted this EKG  EKG Time: 1302 Rate: 67 Rhythm: av dual paced rhythm  Axis: left axis deviation Intervals: qtc 502 QRS: wide ST changes: no st elevation equivalent Impression: abnormal ekg   ____________________________________________    RADIOLOGY  CT head   IMPRESSION:  Chronic white matter changes. No acute abnormality.      ___________________________________________   PROCEDURES  Procedures  ____________________________________________   INITIAL IMPRESSION / ASSESSMENT AND PLAN / ED COURSE  Pertinent labs & imaging results that were available during my care of the patient were reviewed by me and considered in my medical decision making (see chart for details).  Patient presented to the emergency department today after an episode of weakness and it sounds like possible syncope. On exam patient is no awake and fairly alert. Some confusional the patient does have a history of dementia. No focal neuro deficits on exam. Will plan on checking blood work, head CT.  Clinical Course    Head CT and blood work including 2 Trop is negative. A signs of infection and blood work or urine. Patient continues to do well here in the emergency department. His pacemaker was interrogated and did not show any concerning findings. Did have a long discussion with the son and patient. Did discuss that at this point everything looks okay and I think it is reasonable for him to go home and follow up as an outpatient. Did offer to discuss with hospitalists for possible admission  however patient and son declined. Patient did want to go home. We did ambulate patient and he did well.  ____________________________________________   FINAL CLINICAL IMPRESSION(S) / ED DIAGNOSES  Final diagnoses:  Weakness  Near syncope     Note: This dictation was prepared with Dragon dictation. Any transcriptional errors that result from this process are unintentional    Phineas SemenGraydon Claxton Levitz, MD 11/14/16 2003

## 2016-11-14 NOTE — ED Notes (Signed)
Pacemaker interrogation completed per Dr. Derrill KayGoodman request.

## 2016-11-14 NOTE — ED Notes (Signed)
Patient returned from CT

## 2016-11-14 NOTE — Discharge Instructions (Signed)
Please seek medical attention for any high fevers, chest pain, shortness of breath, change in behavior, persistent vomiting, bloody stool or any other new or concerning symptoms.  

## 2016-11-14 NOTE — ED Triage Notes (Signed)
Pt came to ED via EMS from church. Pt has been sick with cold for past 2 days. Today in church, pt felt weak and lethargic. History of cabg x3.

## 2016-11-14 NOTE — ED Notes (Signed)
Pt ambulated in hall without difficulty. Gait steady. Pt denied dizziness or lightheadedness.

## 2016-11-15 DIAGNOSIS — R4182 Altered mental status, unspecified: Secondary | ICD-10-CM | POA: Diagnosis not present

## 2016-11-15 DIAGNOSIS — R270 Ataxia, unspecified: Secondary | ICD-10-CM | POA: Diagnosis not present

## 2016-11-15 DIAGNOSIS — I482 Chronic atrial fibrillation: Secondary | ICD-10-CM | POA: Diagnosis not present

## 2016-11-15 DIAGNOSIS — R55 Syncope and collapse: Secondary | ICD-10-CM | POA: Diagnosis not present

## 2016-11-15 DIAGNOSIS — I25708 Atherosclerosis of coronary artery bypass graft(s), unspecified, with other forms of angina pectoris: Secondary | ICD-10-CM | POA: Diagnosis not present

## 2016-11-15 DIAGNOSIS — F0150 Vascular dementia without behavioral disturbance: Secondary | ICD-10-CM | POA: Diagnosis not present

## 2016-12-07 ENCOUNTER — Encounter: Payer: Self-pay | Admitting: Neurology

## 2016-12-07 ENCOUNTER — Ambulatory Visit (INDEPENDENT_AMBULATORY_CARE_PROVIDER_SITE_OTHER): Payer: Commercial Managed Care - HMO | Admitting: Neurology

## 2016-12-07 VITALS — BP 159/75 | HR 87 | Resp 20 | Ht 70.0 in | Wt 150.0 lb

## 2016-12-07 DIAGNOSIS — R413 Other amnesia: Secondary | ICD-10-CM

## 2016-12-07 DIAGNOSIS — R404 Transient alteration of awareness: Secondary | ICD-10-CM | POA: Insufficient documentation

## 2016-12-07 NOTE — Patient Instructions (Signed)
We will get an EEG study.

## 2016-12-07 NOTE — Progress Notes (Signed)
Reason for visit: Syncope  Referring physician: Dr. Virgie DadSparks  Nathan Saunders is a 82 y.o. male  History of present illness:  Nathan Saunders is an 82 year old left-handed white male with a history of a progressive memory disorder, and a history of atrial fibrillation with a pacemaker insertion. The patient has at least a moderate level small vessel disease. The patient recently has been found to have a low vitamin B12 level, he is now on vitamin B12 shots. The patient is sent to this office for evaluation of a new problem. The patient had an episode of altered mental status that occurred while in church on 11/14/2016. The patient appeared to be somewhat shaky or tremulous earlier on that day, but in church he began to have jerks and twitches of the arms, he did not completely lose consciousness, he was able to respond some but he was more confused than usual. The patient was taken to the emergency room, his pacemaker was interrogated with no evidence of a heart rhythm abnormality that would explain the event. The duration of the episode was about 5 hours before the patient was able to return to his usual baseline. A CT scan of the brain was done, this did not show any acute changes. The patient eventually did return to baseline, but he had another event similar to the first that occurred on Thanksgiving day. The patient again appeared to be somewhat jerky and tremulous for several hours, and eventually returned back to baseline. The patient has not had any other events. The patient reports no headache, vision changes, numbness or weakness of extremities. The patient indicated that he felt somewhat dizzy or floaty headed during the event. The patient has not had any change in bowel or bladder control or balance. During episodes, the patient is not able to walk. He is sent to this office for an evaluation. A carotid Doppler study and 2-D echocardiogram have been set up but not yet done.  Past Medical  History:  Diagnosis Date  . AICD (automatic cardioverter/defibrillator) present   . Alzheimer's disease 04/18/2015  . Anemia of chronic disease   . Atherosclerotic heart disease 1987   Multivessel disease, referred for CABG @[facility]  (Cardilogist - [facility]alski[facility]  . Benign enlargement of prostate   . Cervical spondylosis   . Cervicogenic headache   . Cervicogenic headache   . Chronic atrial fibrillation (HCC)   . 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 [facility] and SVG OM and OM in Jan '01; PTCA of OM 2 ISR in Aug '02; recurrent ISR in OM 2 - PCI with Cypher DES in Oct '03; d. SVG-OM occluded - PCI to mCx & OM2 with Promus 3.0 mm x 8 mm stents; 12/'16 -Cardiolite - Negative  . Dyslipidemia   . Falls   . Gait disturbance   . History of complete heart block September 2004   Dual-chamber put pacemaker placed [facility])  . Hypertension   . Insomnia   . Ischemic cardiomyopathy    (EF 55% by Cardiolite in 11/2015) - but Echo in 11/2015: EF ~40% with apical akinesis and global hypokinesis, moderate LA dilation, mild RA dilation  . Memory disturbance 09/24/2014   progressive; vascular dementia  . S/P CABG x 3 1987   LIMA-LAD, SVG-OM 2, SVG RCA    Past Surgical History:  Procedure Laterality Date  . CARDIAC CATHETERIZATION N/A 01/26/2016  Procedure: Left Heart Cath and Coronary Angiography;  Surgeon: Tonny BollmanMichael Cooper, MD;  [facility];  Service: Cardiovascular;  Laterality: N/A;  . CHOLECYSTECTOMY  2013   . CORONARY ARTERY BYPASS GRAFT     [facility]: LIMA-LAD, SVG OM and OM 2, SVG-PDA  . CORONARY STENT PLACEMENT  Jan 2001, Aug 2002, Oct 2003; 8/06   a. 1/01 -BMS to SVG-RCA and SVG-OM 2; b. ISR in SVG-OM2 PTCA; c. Re-ISR SVG-OM2 --> Cypher DES;; d. SVG-OM occluded - PCI to mCx & OM2 with Promus 3.0 mm x 8 mm stents  . hernia repair Bilateral   . NM MYOVIEW LTD  December 2016    [facility]: Negative Cardiolite, normal wall motion =- EF ~55%  . PACEMAKER PLACEMENT  September 2004   [facility] - Dr. Darrold JunkerParaschos  . TRANSTHORACIC ECHOCARDIOGRAM  December 2016    [facility]: EF ~40% with apical akinesis and global hypokinesis, moderate LA dilation, mild RA dilation  . TRANSURETHRAL RESECTION OF PROSTATE      Family History  Problem Relation Age of Onset  . Heart attack Mother   . Heart attack Father   . Heart disease Brother   . Lung cancer Brother   . Lung cancer Sister   . Dementia Neg Hx     Social history:  reports that he has quit smoking. His smoking use included Cigarettes. He has a 15.00 pack-year smoking history. He has never used smokeless tobacco. He reports that he does not drink alcohol or use drugs.  Medications:  Prior to Admission medications   Medication Sig Start Date End Date Taking? Authorizing Provider  amLODipine (NORVASC) 5 MG tablet Take 1 tablet (5 mg total) by mouth daily. 01/28/16  Yes Bhavinkumar Bhagat, PA  apixaban (ELIQUIS) 5 MG TABS tablet Take 5 mg by mouth daily. 08/11/16  Yes Historical Provider, MD  atorvastatin (LIPITOR) 40 MG tablet Take 40 mg by mouth daily.   Yes Historical Provider, MD  donepezil (ARICEPT) 5 MG tablet Take 1 tablet (5 mg total) by mouth at bedtime. 05/25/16  Yes York Spanielharles K Willis, MD  donepezil (ARICEPT) 5 MG tablet Take 1 tablet (5 mg total) by mouth at bedtime. 08/19/16  Yes York Spanielharles K Willis, MD  finasteride (PROSCAR) 5 MG tablet Take 5 mg by mouth daily. 09/18/14  Yes Historical Provider, MD  isosorbide mononitrate (IMDUR) 60 MG 24 hr tablet Take 60 mg by mouth daily. 09/24/14  Yes Historical Provider, MD  meloxicam (MOBIC) 7.5 MG tablet Take 7.5 mg by mouth daily. 06/14/14  Yes Historical Provider, MD  metoprolol succinate (TOPROL-XL) 100 MG 24 hr tablet Take 100 mg by mouth daily. 09/03/14  Yes Historical Provider, MD  NAMENDA XR 28 MG CP24 24 hr capsule Take 28 mg by mouth daily.  04/01/15  Yes Historical Provider, MD  nitroGLYCERIN (NITRODUR - DOSED IN MG/24 HR) 0.1 mg/hr patch Place 0.1 mg onto the skin daily as needed (for angina).  09/06/14  Yes Historical Provider, MD  pantoprazole (PROTONIX) 40 MG tablet Take 40 mg by mouth daily. 09/06/14  Yes Historical Provider, MD      Allergies  Allergen Reactions  . Seroquel [Quetiapine Fumarate] Other (See Comments)    Nightmares  . Serotonin Reuptake Inhibitors (Ssris) Other (See Comments)    Nightmares? Maybe confused for seroquel interaction (not SSRI)    ROS:  Out of a complete 14 system review of symptoms, the patient complains only of the following symptoms, and all other reviewed systems  are negative.  Memory loss Chest pain Tremors, near syncope Anxiety  Blood pressure (!) 159/75, pulse 87, resp. rate 20, height 5\' 10"  (1.778 m), weight 150 lb (68 kg).  Physical Exam  General: The patient is alert and cooperative at the time of the examination.  Eyes: Pupils are equal, round, and reactive to light. Discs are flat bilaterally.  Neck: The neck is supple, no carotid bruits are noted.  Respiratory: The respiratory examination is clear.  Cardiovascular: The cardiovascular examination reveals a regular rate and rhythm, no obvious murmurs or rubs are noted.  Skin: Extremities are without significant edema.  Neurologic Exam  Mental status: The patient is alert and oriented x 2 at the time of the examination (not oriented to date). The Mini-Mental Status Examination done today shows a total score of 11/30..  Cranial nerves: Facial symmetry is present. There is good sensation of the face to pinprick and soft touch bilaterally. The strength of the facial muscles and the muscles to head turning and shoulder shrug are normal bilaterally. Speech is well enunciated, no aphasia or dysarthria is noted. Extraocular movements are full. Visual fields are full. The tongue is midline, and the patient has symmetric  elevation of the soft palate. No obvious hearing deficits are noted.  Motor: The motor testing reveals 5 over 5 strength of all 4 extremities. Good symmetric motor tone is noted throughout.  Sensory: Sensory testing is intact to pinprick, soft touch, and vibration sensation on all 4 extremities. No evidence of extinction is noted. Position sensation was decreased on all 4 extremities.  Coordination: Cerebellar testing reveals good finger-nose-finger and heel-to-shin bilaterally. Some apraxia with the use of the lower extremities was noted.  Gait and station: Gait is normal. Tandem gait is unsteady. Romberg is negative. No drift is seen.  Reflexes: Deep tendon reflexes are symmetric and normal bilaterally. Toes are downgoing bilaterally.   CT head 11/14/16:  IMPRESSION: Chronic white matter changes.  No acute abnormality.  * CT scan images were reviewed online. I agree with the written report.    Assessment/Plan:  1. Progressive memory disturbance  2. Small vessel disease by CT brain  3. Vitamin B12 deficiency  4. Episodic transient alteration of awareness  The patient is having episodes of twitchiness and altered mental status that may go on for several hours. The patient possibly may be having seizure events. A carotid Doppler study and 2-D echocardiogram has been set up. We will check an EEG study. If the events recur, an empiric trial on Keppra may be warranted. The patient will follow-up for the next scheduled visit in February. The patient does not operate a motor vehicle.  Marlan Palau MD 12/07/2016 7:37 AM  [facility] [address] [place], [place] [address]  Phone [phone] Fax [phone]

## 2016-12-09 ENCOUNTER — Encounter: Payer: Self-pay | Admitting: Neurology

## 2016-12-09 ENCOUNTER — Ambulatory Visit (HOSPITAL_COMMUNITY)
Admission: RE | Admit: 2016-12-09 | Discharge: 2016-12-09 | Disposition: A | Discharge status: Home / Self Care | Payer: Commercial Managed Care - HMO | Source: Ambulatory Visit | Attending: Neurology | Admitting: Neurology

## 2016-12-09 ENCOUNTER — Telehealth: Payer: Self-pay | Admitting: Neurology

## 2016-12-09 ENCOUNTER — Ambulatory Visit (INDEPENDENT_AMBULATORY_CARE_PROVIDER_SITE_OTHER): Payer: Commercial Managed Care - HMO | Admitting: Neurology

## 2016-12-09 VITALS — BP 162/81 | HR 84 | Ht 69.5 in | Wt 154.0 lb

## 2016-12-09 DIAGNOSIS — R404 Transient alteration of awareness: Secondary | ICD-10-CM | POA: Insufficient documentation

## 2016-12-09 DIAGNOSIS — R4182 Altered mental status, unspecified: Secondary | ICD-10-CM | POA: Diagnosis not present

## 2016-12-09 MED ORDER — LEVETIRACETAM 250 MG PO TABS: 250.0000 mg | ORAL_TABLET | Freq: Two times a day (BID) | ORAL | 2 refills | Status: DC

## 2016-12-09 NOTE — Progress Notes (Signed)
EEG Completed; Results Pending  

## 2016-12-09 NOTE — Telephone Encounter (Signed)
I called the patient. The study was normal. The patient did have some left shoulder jerking during the study, does not correlate with epileptiform discharges.  The patient appears to have been on gabapentin that was started recently. If this correlates with the onset of symptoms, the gabapentin can cause myoclonus and asterixis and gait instability.  The patient may need to stop the gabapentin and see how he does over the next 2 weeks, hold off on initiating the Keppra prescription called in.

## 2016-12-09 NOTE — Procedures (Signed)
EEG report.   Brief clinical history: 84-year male with a history of a progressive memory disorder, and a history of atrial fibrillation with a pacemaker insertion. The patient has at least a moderate level small vessel disease. Technique: this is a 17 channel routine scalp EEG performed at the bedside with bipolar and monopolar montages arranged in accordance to the international 10/20 system of electrode placement. One channel was dedicated to EKG recording.  The study was performed during wakefulness, drowsiness, and stage 2 sleep. No activating procedures performed.  Description:In the wakeful state, the best background consisted of a medium amplitude, posterior dominant, well sustained, symmetric and reactive 9 Hz rhythm. Drowsiness demonstrated dropout of the alpha rhythm. Stage 2 sleep showed symmetric and synchronous sleep spindles without intermixed epileptiform discharges. No focal or generalized epileptiform discharges noted.  No pathologic areas of slowing seen.  Importantly, throughout the recording patient had several episodes of left shoulder jerkiness that were not associated with concomitant EEG changes. EKG showed sinus rhythm.  Impression: this is a normal awake and asleep EEG. Please, be aware that a normal EEG does not exclude the possibility of epilepsy.  Patient had recurrent episodes of left shoulder jerkiness during the study but they were not associated with concomitant electrographic changes. Clinical correlation is advised.   Nathan Portelasvaldo Ajahnae Rathgeber, MD

## 2016-12-09 NOTE — Progress Notes (Signed)
Reason for visit: Altered mental status  Nathan Saunders is an 82 y.o. male  History of present illness:  [name] is an 82 year old left-handed white male with a history of episodes of jerkiness and gait instability. The patient had onset of another event last evening that has carried into today, the family claims it will come and go during the day. The patient appears to have twitching as of the left shoulder primarily, occasionally on the right, and his walking has become ataxic. With this most recent episode he has not had as much confusion. The patient has been set for an EEG study, but this has not yet been done. He returns for an urgent reevaluation.  Past Medical History:  Diagnosis Date  . AICD (automatic cardioverter/defibrillator) present   . Alzheimer's disease 04/18/2015  . Anemia of chronic disease   . Atherosclerotic heart disease 1987   Multivessel disease, referred for CABG @[facility]  (Cardilogist - [facility]alski[facility])  . Benign enlargement of prostate   . Cervical spondylosis   . Cervicogenic headache   . Cervicogenic headache   . Chronic atrial fibrillation (HCC)   . 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 and OM in Jan '01; PTCA of OM 2 ISR in Aug '02; recurrent ISR in OM 2 - PCI with Cypher DES in Oct '03; d. SVG-OM occluded - PCI to mCx & OM2 with Promus 3.0 mm x 8 mm stents; 12/'16 -Cardiolite - Negative  . Dyslipidemia   . Falls   . Gait disturbance   . History of complete heart block September 2004   Dual-chamber put pacemaker placed [facility])  . Hypertension   . Insomnia   . Ischemic cardiomyopathy    (EF 55% by Cardiolite in 11/2015) - but Echo in 11/2015: EF ~40% with apical akinesis and global hypokinesis, moderate LA dilation, mild RA dilation  . Memory disturbance 09/24/2014   progressive; vascular dementia  . S/P CABG x 3 1987   LIMA-LAD, SVG-OM 2, SVG RCA    Past Surgical History:  Procedure Laterality Date  . CARDIAC CATHETERIZATION N/A 01/26/2016   Procedure: Left Heart Cath and Coronary Angiography;  Surgeon: Tonny BollmanMichael Cooper, MD;  [facility];  Service: Cardiovascular;  Laterality: N/A;  . CHOLECYSTECTOMY  2013   . CORONARY ARTERY BYPASS GRAFT     [facility]: LIMA-LAD, SVG OM and OM 2, SVG-PDA  . CORONARY STENT PLACEMENT  Jan 2001, Aug 2002, Oct 2003; 8/06   a. 1/01 -BMS to SVG-RCA and SVG-OM 2; b. ISR in SVG-OM2 PTCA; c. Re-ISR SVG-OM2 --> Cypher DES;; d. SVG-OM occluded - PCI to mCx & OM2 with Promus 3.0 mm x 8 mm stents  . hernia repair Bilateral   . NM MYOVIEW LTD  December 2016   [facility]: Negative Cardiolite, normal wall motion =- EF ~55%  . PACEMAKER PLACEMENT  September 2004   [facility] - Dr. Darrold JunkerParaschos  . TRANSTHORACIC ECHOCARDIOGRAM  December 2016    [facility]: EF ~40% with apical akinesis and global hypokinesis, moderate LA dilation, mild RA dilation  . TRANSURETHRAL RESECTION OF PROSTATE      Family History  Problem Relation Age of Onset  . Heart attack Mother   . Heart attack Father   . Heart disease Brother   . Lung cancer Brother   . Lung cancer Sister   .  Dementia Neg Hx     Social history:  reports that he has quit smoking. His smoking use included Cigarettes. He has a 15.00 pack-year smoking history. He has never used smokeless tobacco. He reports that he does not drink alcohol or use drugs.    Allergies  Allergen Reactions  . Seroquel [Quetiapine Fumarate] Other (See Comments)    Nightmares  . Serotonin Reuptake Inhibitors (Ssris) Other (See Comments)    Nightmares? Maybe confused for seroquel interaction (not SSRI)    Medications:  Prior to Admission medications   Medication Sig Start Date End Date Taking? Authorizing Provider  amLODipine (NORVASC) 5 MG tablet Take 1 tablet (5 mg total) by mouth daily. 01/28/16  Yes Bhavinkumar  Bhagat, PA  apixaban (ELIQUIS) 5 MG TABS tablet Take 5 mg by mouth daily. 08/11/16  Yes Historical Provider, MD  atorvastatin (LIPITOR) 40 MG tablet Take 40 mg by mouth daily.   Yes Historical Provider, MD  donepezil (ARICEPT) 5 MG tablet Take 1 tablet (5 mg total) by mouth at bedtime. 05/25/16  Yes York Spaniel, MD  donepezil (ARICEPT) 5 MG tablet Take 1 tablet (5 mg total) by mouth at bedtime. 08/19/16  Yes York Spaniel, MD  finasteride (PROSCAR) 5 MG tablet Take 5 mg by mouth daily. 09/18/14  Yes Historical Provider, MD  isosorbide mononitrate (IMDUR) 60 MG 24 hr tablet Take 60 mg by mouth daily. 09/24/14  Yes Historical Provider, MD  meloxicam (MOBIC) 7.5 MG tablet Take 7.5 mg by mouth daily. 06/14/14  Yes Historical Provider, MD  metoprolol succinate (TOPROL-XL) 100 MG 24 hr tablet Take 100 mg by mouth daily. 09/03/14  Yes Historical Provider, MD  NAMENDA XR 28 MG CP24 24 hr capsule Take 28 mg by mouth daily. 04/01/15  Yes Historical Provider, MD  nitroGLYCERIN (NITRODUR - DOSED IN MG/24 HR) 0.1 mg/hr patch Place 0.1 mg onto the skin daily as needed (for angina).  09/06/14  Yes Historical Provider, MD  pantoprazole (PROTONIX) 40 MG tablet Take 40 mg by mouth daily. 09/06/14  Yes Historical Provider, MD  levETIRAcetam (KEPPRA) 250 MG tablet Take 1 tablet (250 mg total) by mouth 2 (two) times daily. 12/09/16   York Spaniel, MD    ROS:  Out of a complete 14 system review of symptoms, the patient complains only of the following symptoms, and all other reviewed systems are negative.  Tremors  Blood pressure (!) 162/81, pulse 84, height 5' 9.5" (1.765 m), weight 154 lb (69.9 kg).  Physical Exam  General: The patient is alert and cooperative at the time of the examination.  Skin: No significant peripheral edema is noted.   Neurologic Exam  Mental status: The patient is alert and oriented x 3 at the time of the examination. The patient has apparent normal recent and remote memory, with  an apparently normal attention span and concentration ability.   Cranial nerves: Facial symmetry is present. Speech is normal, no aphasia or dysarthria is noted. Extraocular movements are full. Visual fields are full.  Motor: The patient has good strength in all 4 extremities.  Sensory examination: Soft touch sensation is symmetric on the face, arms, and legs.  Coordination: The patient has good finger-nose-finger and heel-to-shin bilaterally. Intermittently, jerkiness of the left shoulder greater than right shoulder was noted. With physical activity of the arm, the jerkiness seemed to be suppressed. No asterixis was seen.  Gait and station: The patient has a wide-based, ataxic gait. Tandem gait was not attempted. Romberg is negative.  No drift is seen.  Reflexes: Deep tendon reflexes are symmetric.   Assessment/Plan:  1. Myoclonic jerks, left greater than right shoulder  2. Gait disorder  The patient is having intermittent episodes of jerkiness of the shoulders, left greater than right and an alteration in his ability to ambulate. The episodes may represent seizures, we will start Keppra at this time, we will try to get EEG evaluation as soon as possible.   Addendum: EEG was done today, appear to be normal, episodes of left shoulder jerking did not appear to translate to epileptiform discharges. It appears that the patient has started taking gabapentin recently, this could result in an altered mental status, and gait disturbance, possibly myoclonus. The patient was contacted, and I indicated he should stop the gabapentin, hold off on the Keppra prescription for now. If the patient improves off of gabapentin, this could be the etiology of his symptoms. Dose of the gabapentin is not known.  Marlan Palau. Keith Onesha Krebbs MD 12/09/2016 2:04 PM  [facility] [address] [place], [place] [address]  Phone [phone] Fax [phone]

## 2016-12-29 DIAGNOSIS — E538 Deficiency of other specified B group vitamins: Secondary | ICD-10-CM | POA: Diagnosis not present

## 2016-12-29 DIAGNOSIS — R0602 Shortness of breath: Secondary | ICD-10-CM | POA: Diagnosis not present

## 2016-12-29 DIAGNOSIS — I10 Essential (primary) hypertension: Secondary | ICD-10-CM | POA: Diagnosis not present

## 2016-12-29 DIAGNOSIS — J208 Acute bronchitis due to other specified organisms: Secondary | ICD-10-CM | POA: Diagnosis not present

## 2016-12-29 DIAGNOSIS — F0150 Vascular dementia without behavioral disturbance: Secondary | ICD-10-CM | POA: Diagnosis not present

## 2016-12-29 DIAGNOSIS — B9689 Other specified bacterial agents as the cause of diseases classified elsewhere: Secondary | ICD-10-CM | POA: Diagnosis not present

## 2016-12-29 DIAGNOSIS — Z79899 Other long term (current) drug therapy: Secondary | ICD-10-CM | POA: Diagnosis not present

## 2016-12-29 DIAGNOSIS — I482 Chronic atrial fibrillation: Secondary | ICD-10-CM | POA: Diagnosis not present

## 2016-12-29 DIAGNOSIS — E782 Mixed hyperlipidemia: Secondary | ICD-10-CM | POA: Diagnosis not present

## 2017-01-19 ENCOUNTER — Other Ambulatory Visit: Payer: Commercial Managed Care - HMO

## 2017-01-29 ENCOUNTER — Emergency Department: Payer: Medicare HMO

## 2017-01-29 ENCOUNTER — Observation Stay
Admission: EM | Admit: 2017-01-29 | Discharge: 2017-01-30 | Disposition: A | Discharge status: Home / Self Care | Payer: Medicare HMO | Attending: Internal Medicine | Admitting: Internal Medicine

## 2017-01-29 ENCOUNTER — Encounter: Payer: Self-pay | Admitting: Internal Medicine

## 2017-01-29 DIAGNOSIS — Z955 Presence of coronary angioplasty implant and graft: Secondary | ICD-10-CM | POA: Insufficient documentation

## 2017-01-29 DIAGNOSIS — I255 Ischemic cardiomyopathy: Secondary | ICD-10-CM | POA: Insufficient documentation

## 2017-01-29 DIAGNOSIS — R4182 Altered mental status, unspecified: Secondary | ICD-10-CM

## 2017-01-29 DIAGNOSIS — I25110 Atherosclerotic heart disease of native coronary artery with unstable angina pectoris: Secondary | ICD-10-CM | POA: Diagnosis not present

## 2017-01-29 DIAGNOSIS — I25710 Atherosclerosis of autologous vein coronary artery bypass graft(s) with unstable angina pectoris: Secondary | ICD-10-CM | POA: Diagnosis not present

## 2017-01-29 DIAGNOSIS — R6889 Other general symptoms and signs: Secondary | ICD-10-CM | POA: Diagnosis not present

## 2017-01-29 DIAGNOSIS — Z95810 Presence of automatic (implantable) cardiac defibrillator: Secondary | ICD-10-CM | POA: Insufficient documentation

## 2017-01-29 DIAGNOSIS — F0390 Unspecified dementia without behavioral disturbance: Secondary | ICD-10-CM | POA: Diagnosis not present

## 2017-01-29 DIAGNOSIS — Z7901 Long term (current) use of anticoagulants: Secondary | ICD-10-CM | POA: Diagnosis not present

## 2017-01-29 DIAGNOSIS — F0150 Vascular dementia without behavioral disturbance: Secondary | ICD-10-CM | POA: Insufficient documentation

## 2017-01-29 DIAGNOSIS — Z87891 Personal history of nicotine dependence: Secondary | ICD-10-CM | POA: Insufficient documentation

## 2017-01-29 DIAGNOSIS — G309 Alzheimer's disease, unspecified: Secondary | ICD-10-CM | POA: Insufficient documentation

## 2017-01-29 DIAGNOSIS — N400 Enlarged prostate without lower urinary tract symptoms: Secondary | ICD-10-CM | POA: Diagnosis not present

## 2017-01-29 DIAGNOSIS — D696 Thrombocytopenia, unspecified: Secondary | ICD-10-CM | POA: Diagnosis not present

## 2017-01-29 DIAGNOSIS — Z79899 Other long term (current) drug therapy: Secondary | ICD-10-CM | POA: Diagnosis not present

## 2017-01-29 DIAGNOSIS — Z951 Presence of aortocoronary bypass graft: Secondary | ICD-10-CM | POA: Diagnosis not present

## 2017-01-29 DIAGNOSIS — R55 Syncope and collapse: Secondary | ICD-10-CM | POA: Diagnosis not present

## 2017-01-29 DIAGNOSIS — E785 Hyperlipidemia, unspecified: Secondary | ICD-10-CM | POA: Insufficient documentation

## 2017-01-29 DIAGNOSIS — I2510 Atherosclerotic heart disease of native coronary artery without angina pectoris: Secondary | ICD-10-CM | POA: Diagnosis not present

## 2017-01-29 DIAGNOSIS — I10 Essential (primary) hypertension: Secondary | ICD-10-CM | POA: Insufficient documentation

## 2017-01-29 DIAGNOSIS — I482 Chronic atrial fibrillation: Secondary | ICD-10-CM | POA: Insufficient documentation

## 2017-01-29 DIAGNOSIS — Z791 Long term (current) use of non-steroidal anti-inflammatories (NSAID): Secondary | ICD-10-CM | POA: Insufficient documentation

## 2017-01-29 DIAGNOSIS — R569 Unspecified convulsions: Secondary | ICD-10-CM | POA: Diagnosis not present

## 2017-01-29 DIAGNOSIS — R251 Tremor, unspecified: Secondary | ICD-10-CM | POA: Diagnosis not present

## 2017-01-29 DIAGNOSIS — R079 Chest pain, unspecified: Secondary | ICD-10-CM

## 2017-01-29 DIAGNOSIS — F0280 Dementia in other diseases classified elsewhere without behavioral disturbance: Secondary | ICD-10-CM | POA: Diagnosis present

## 2017-01-29 DIAGNOSIS — R410 Disorientation, unspecified: Secondary | ICD-10-CM | POA: Diagnosis not present

## 2017-01-29 DIAGNOSIS — D649 Anemia, unspecified: Secondary | ICD-10-CM | POA: Diagnosis not present

## 2017-01-29 LAB — COMPREHENSIVE METABOLIC PANEL
ALBUMIN: 3.7 g/dL (ref 3.5–5.0)
ALT: 20 U/L (ref 17–63)
ANION GAP: 6 (ref 5–15)
AST: 22 U/L (ref 15–41)
Alkaline Phosphatase: 77 U/L (ref 38–126)
BUN: 25 mg/dL — ABNORMAL HIGH (ref 6–20)
CHLORIDE: 107 mmol/L (ref 101–111)
CO2: 28 mmol/L (ref 22–32)
CREATININE: 1.05 mg/dL (ref 0.61–1.24)
Calcium: 9.1 mg/dL (ref 8.9–10.3)
GFR calc non Af Amer: >60 mL/min (ref >60)
GLUCOSE: 92 mg/dL (ref 65–99)
Potassium: 3.6 mmol/L (ref 3.5–5.1)
SODIUM: 141 mmol/L (ref 135–145)
Total Bilirubin: 1.3 mg/dL — ABNORMAL HIGH (ref 0.3–1.2)
Total Protein: 6.5 g/dL (ref 6.5–8.1)

## 2017-01-29 LAB — CBC WITH DIFFERENTIAL/PLATELET
Basophils Absolute: 0 10*3/uL (ref 0–0.1)
Basophils Relative: 1 %
Eosinophils Absolute: 0.3 10*3/uL (ref 0–0.7)
Eosinophils Relative: 5 %
HCT: 41 % (ref 40.0–52.0)
HEMOGLOBIN: 14.1 g/dL (ref 13.0–18.0)
LYMPHS ABS: 1.6 10*3/uL (ref 1.0–3.6)
LYMPHS PCT: 31 %
MCH: 31.8 pg (ref 26.0–34.0)
MCHC: 34.5 g/dL (ref 32.0–36.0)
MCV: 92.4 fL (ref 80.0–100.0)
Monocytes Absolute: 0.6 10*3/uL (ref 0.2–1.0)
Monocytes Relative: 11 %
NEUTROS PCT: 52 %
Neutro Abs: 2.8 10*3/uL (ref 1.4–6.5)
Platelets: 139 10*3/uL — ABNORMAL LOW (ref 150–440)
RBC: 4.44 MIL/uL (ref 4.40–5.90)
RDW: 13.6 % (ref 11.5–14.5)
WBC: 5.3 10*3/uL (ref 3.8–10.6)

## 2017-01-29 LAB — URINALYSIS, COMPLETE (UACMP) WITH MICROSCOPIC
BACTERIA UA: NONE SEEN
BILIRUBIN URINE: NEGATIVE
Glucose, UA: NEGATIVE mg/dL
Ketones, ur: NEGATIVE mg/dL
LEUKOCYTES UA: NEGATIVE
NITRITE: NEGATIVE
PROTEIN: NEGATIVE mg/dL
Specific Gravity, Urine: 1.01 (ref 1.005–1.030)
pH: 6 (ref 5.0–8.0)

## 2017-01-29 LAB — TROPONIN I

## 2017-01-29 LAB — AMMONIA: Ammonia: 26 umol/L (ref 9–35)

## 2017-01-29 LAB — ETHANOL: Alcohol, Ethyl (B): <5 mg/dL (ref <5)

## 2017-01-29 LAB — BRAIN NATRIURETIC PEPTIDE: B Natriuretic Peptide: 99 pg/mL (ref 0.0–100.0)

## 2017-01-29 LAB — GLUCOSE, CAPILLARY: Glucose-Capillary: 95 mg/dL (ref 65–99)

## 2017-01-29 MED ORDER — FINASTERIDE 5 MG PO TABS
5.0000 mg | ORAL_TABLET | Freq: Every day | ORAL | Status: DC
  Administered 2017-01-29 – 2017-01-30 (×2): 5 mg via ORAL
  Filled 2017-01-29 (×2): qty 1

## 2017-01-29 MED ORDER — LEVETIRACETAM 500 MG PO TABS: 500.0000 mg | ORAL_TABLET | Freq: Two times a day (BID) | ORAL | Status: DC

## 2017-01-29 MED ORDER — LORAZEPAM 2 MG/ML IJ SOLN
1.0000 mg | Freq: Once | INTRAMUSCULAR | Status: AC
  Administered 2017-01-29: 1 mg via INTRAVENOUS
  Filled 2017-01-29: qty 1

## 2017-01-29 MED ORDER — SODIUM CHLORIDE 0.9 % IV SOLN
INTRAVENOUS | Status: DC
  Administered 2017-01-29 – 2017-01-30 (×2): via INTRAVENOUS

## 2017-01-29 MED ORDER — ACETAMINOPHEN 325 MG PO TABS: 650.0000 mg | ORAL_TABLET | Freq: Four times a day (QID) | ORAL | Status: DC | PRN

## 2017-01-29 MED ORDER — APIXABAN 5 MG PO TABS
5.0000 mg | ORAL_TABLET | Freq: Two times a day (BID) | ORAL | Status: DC
  Administered 2017-01-29 – 2017-01-30 (×2): 5 mg via ORAL
  Filled 2017-01-29 (×2): qty 1

## 2017-01-29 MED ORDER — APIXABAN 5 MG PO TABS: 5.0000 mg | ORAL_TABLET | Freq: Every day | ORAL | Status: DC

## 2017-01-29 MED ORDER — AMLODIPINE BESYLATE 5 MG PO TABS
5.0000 mg | ORAL_TABLET | Freq: Every day | ORAL | Status: DC
  Administered 2017-01-29 – 2017-01-30 (×2): 5 mg via ORAL
  Filled 2017-01-29 (×2): qty 1

## 2017-01-29 MED ORDER — SODIUM CHLORIDE 0.9 % IV SOLN
750.0000 mg | Freq: Once | INTRAVENOUS | Status: AC
  Administered 2017-01-29: 750 mg via INTRAVENOUS
  Filled 2017-01-29: qty 7.5

## 2017-01-29 MED ORDER — ATORVASTATIN CALCIUM 20 MG PO TABS
40.0000 mg | ORAL_TABLET | Freq: Every day | ORAL | Status: DC
  Administered 2017-01-29 – 2017-01-30 (×2): 40 mg via ORAL
  Filled 2017-01-29 (×2): qty 2

## 2017-01-29 MED ORDER — LEVETIRACETAM 750 MG PO TABS
750.0000 mg | ORAL_TABLET | Freq: Two times a day (BID) | ORAL | Status: DC
  Administered 2017-01-29 – 2017-01-30 (×2): 750 mg via ORAL
  Filled 2017-01-29 (×2): qty 1

## 2017-01-29 MED ORDER — DOCUSATE SODIUM 100 MG PO CAPS
100.0000 mg | ORAL_CAPSULE | Freq: Two times a day (BID) | ORAL | Status: DC
  Administered 2017-01-29 – 2017-01-30 (×2): 100 mg via ORAL
  Filled 2017-01-29 (×2): qty 1

## 2017-01-29 MED ORDER — MEMANTINE HCL ER 28 MG PO CP24
28.0000 mg | ORAL_CAPSULE | Freq: Every day | ORAL | Status: DC
  Administered 2017-01-29 – 2017-01-30 (×2): 28 mg via ORAL
  Filled 2017-01-29 (×2): qty 1

## 2017-01-29 MED ORDER — BISACODYL 10 MG RE SUPP: 10.0000 mg | Freq: Every day | RECTAL | Status: DC | PRN

## 2017-01-29 MED ORDER — NITROGLYCERIN 0.1 MG/HR TD PT24
0.1000 mg | MEDICATED_PATCH | Freq: Every day | TRANSDERMAL | Status: DC
  Administered 2017-01-29 – 2017-01-30 (×2): 0.1 mg via TRANSDERMAL
  Filled 2017-01-29 (×2): qty 1

## 2017-01-29 MED ORDER — ISOSORBIDE MONONITRATE ER 60 MG PO TB24
60.0000 mg | ORAL_TABLET | Freq: Every day | ORAL | Status: DC
  Administered 2017-01-29 – 2017-01-30 (×2): 60 mg via ORAL
  Filled 2017-01-29 (×2): qty 1

## 2017-01-29 MED ORDER — SODIUM CHLORIDE 0.9% FLUSH
3.0000 mL | Freq: Two times a day (BID) | INTRAVENOUS | Status: DC
  Administered 2017-01-29: 3 mL via INTRAVENOUS

## 2017-01-29 MED ORDER — PANTOPRAZOLE SODIUM 40 MG PO TBEC
40.0000 mg | DELAYED_RELEASE_TABLET | Freq: Every day | ORAL | Status: DC
  Administered 2017-01-29 – 2017-01-30 (×2): 40 mg via ORAL
  Filled 2017-01-29 (×2): qty 1

## 2017-01-29 MED ORDER — LORAZEPAM 2 MG/ML IJ SOLN: 1.0000 mg | INTRAMUSCULAR | Status: DC | PRN

## 2017-01-29 MED ORDER — DONEPEZIL HCL 5 MG PO TABS
5.0000 mg | ORAL_TABLET | Freq: Every day | ORAL | Status: DC
  Administered 2017-01-29: 22:00:00 5 mg via ORAL
  Filled 2017-01-29: qty 1

## 2017-01-29 MED ORDER — NITROGLYCERIN 0.1 MG/HR TD PT24: 0.1000 mg | MEDICATED_PATCH | Freq: Every day | TRANSDERMAL | Status: DC | PRN

## 2017-01-29 MED ORDER — ONDANSETRON HCL 4 MG PO TABS: 4.0000 mg | ORAL_TABLET | Freq: Four times a day (QID) | ORAL | Status: DC | PRN

## 2017-01-29 MED ORDER — METOPROLOL SUCCINATE ER 100 MG PO TB24
100.0000 mg | ORAL_TABLET | Freq: Every day | ORAL | Status: DC
  Administered 2017-01-29 – 2017-01-30 (×2): 100 mg via ORAL
  Filled 2017-01-29 (×2): qty 1

## 2017-01-29 MED ORDER — ACETAMINOPHEN 650 MG RE SUPP: 650.0000 mg | Freq: Four times a day (QID) | RECTAL | Status: DC | PRN

## 2017-01-29 MED ORDER — ONDANSETRON HCL 4 MG/2ML IJ SOLN: 4.0000 mg | Freq: Four times a day (QID) | INTRAMUSCULAR | Status: DC | PRN

## 2017-01-29 NOTE — Progress Notes (Signed)
Son requested to bring in copies of Living WIll and HPOA when able.

## 2017-01-29 NOTE — H&P (Signed)
History and Physical    Nathan Saunders:[id number] DOB: 08-11-1932 DOA: 01/29/2017  Referring physician: Dr. Alphonzo Lemmings PCP: Marguarite Arbour, MD  Specialists: none  Chief Complaint: "Twitching" and confusion  HPI: Nathan Saunders is a 83 y.o. male has a past medical history significant for CAD and mixed Alzheimer's/vascilar dementia now with acute confusion and "twitching" of LUE. Some improvement with IV Ativan in ER but pt remains confused with occasional shking of left arm and leg. He is now admitted. Head CT, labs, and CXR OK. Pt has pacer and cannot undergo MRI. Had similar sx's 1-2 months ago and w/u including EEG and Neuro consult negative. The pt is unable to provide a hx.  Review of Systems: unable to obtain due to acute confusion   Past Medical History:  Diagnosis Date  . AICD (automatic cardioverter/defibrillator) present   . Alzheimer's disease 04/18/2015  . Anemia of chronic disease   . Atherosclerotic heart disease 1987   Multivessel disease, referred for CABG @[facility]  (Cardilogist - Dr. Gwen Pounds, [facility])  . Benign enlargement of prostate   . Cervical spondylosis   . Cervicogenic headache   . Cervicogenic headache   . Chronic atrial fibrillation (HCC)   . 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 PCI to [facility] and SVG OM and OM in Jan '01; PTCA of OM 2 ISR in Aug '02; recurrent ISR in OM 2 - PCI with Cypher DES in Oct '03; d. SVG-OM occluded - PCI to mCx & OM2 with Promus 3.0 mm x 8 mm stents; 12/'16 -Cardiolite - Negative  . Dyslipidemia   . Falls   . Gait disturbance   . History of complete heart block September 2004   Dual-chamber put pacemaker placed [facility])  . Hypertension   . Insomnia   . Ischemic cardiomyopathy    (EF 55% by Cardiolite in 11/2015) - but Echo in 11/2015: EF ~40% with apical akinesis and global hypokinesis, moderate LA dilation, mild RA dilation  .  Memory disturbance 09/24/2014   progressive; vascular dementia  . S/P CABG x 3 1987   LIMA-LAD, SVG-OM 2, SVG RCA   Past Surgical History:  Procedure Laterality Date  . CARDIAC CATHETERIZATION N/A 01/26/2016   Procedure: Left Heart Cath and Coronary Angiography;  Surgeon: Tonny Bollman, MD;  Location: [facility] INVASIVE CV LAB;  Service: Cardiovascular;  Laterality: N/A;  . CHOLECYSTECTOMY  2013   . CORONARY ARTERY BYPASS GRAFT     [facility]: LIMA-LAD, SVG OM and OM 2, SVG-PDA  . CORONARY STENT PLACEMENT  Jan 2001, Aug 2002, Oct 2003; 8/06   a. 1/01 -BMS to SVG-RCA and SVG-OM 2; b. ISR in SVG-OM2 PTCA; c. Re-ISR SVG-OM2 --> Cypher DES;; d. SVG-OM occluded - PCI to mCx & OM2 with Promus 3.0 mm x 8 mm stents  . hernia repair Bilateral   . NM MYOVIEW LTD  December 2016   [facility]: Negative Cardiolite, normal wall motion =- EF ~55%  . PACEMAKER PLACEMENT  September 2004   Dual-chamber, ([facility]/ [place]) - Dr. Darrold Junker  . TRANSTHORACIC ECHOCARDIOGRAM  December 2016    [facility]: EF ~40% with apical akinesis and global hypokinesis, moderate LA dilation, mild RA dilation  . TRANSURETHRAL RESECTION OF PROSTATE     Social History:  reports that he has quit smoking. His smoking use included Cigarettes. He has a 15.00 pack-year smoking history. He has never used smokeless tobacco. He reports that  he does not drink alcohol or use drugs.  Allergies  Allergen Reactions  . Seroquel [Quetiapine Fumarate] Other (See Comments)    Nightmares  . Serotonin Reuptake Inhibitors (Ssris) Other (See Comments)    Nightmares? Maybe confused for seroquel interaction (not SSRI)    Family History  Problem Relation Age of Onset  . Heart attack Mother   . Heart attack Father   . Heart disease Brother   . Lung cancer Brother   . Lung cancer Sister   . Dementia Neg Hx     Prior to Admission medications   Medication Sig Start Date End Date Taking? Authorizing Provider  amLODipine (NORVASC) 5 MG tablet Take 1  tablet (5 mg total) by mouth daily. 01/28/16   Bhavinkumar Bhagat, PA  apixaban (ELIQUIS) 5 MG TABS tablet Take 5 mg by mouth daily. 08/11/16   Historical Provider, MD  atorvastatin (LIPITOR) 40 MG tablet Take 40 mg by mouth daily.    Historical Provider, MD  donepezil (ARICEPT) 5 MG tablet Take 1 tablet (5 mg total) by mouth at bedtime. 05/25/16   York Spanielharles K Willis, MD  donepezil (ARICEPT) 5 MG tablet Take 1 tablet (5 mg total) by mouth at bedtime. 08/19/16   York Spanielharles K Willis, MD  finasteride (PROSCAR) 5 MG tablet Take 5 mg by mouth daily. 09/18/14   Historical Provider, MD  isosorbide mononitrate (IMDUR) 60 MG 24 hr tablet Take 60 mg by mouth daily. 09/24/14   Historical Provider, MD  levETIRAcetam (KEPPRA) 250 MG tablet Take 1 tablet (250 mg total) by mouth 2 (two) times daily. 12/09/16   York Spanielharles K Willis, MD  meloxicam (MOBIC) 7.5 MG tablet Take 7.5 mg by mouth daily. 06/14/14   Historical Provider, MD  metoprolol succinate (TOPROL-XL) 100 MG 24 hr tablet Take 100 mg by mouth daily. 09/03/14   Historical Provider, MD  NAMENDA XR 28 MG CP24 24 hr capsule Take 28 mg by mouth daily. 04/01/15   Historical Provider, MD  nitroGLYCERIN (NITRODUR - DOSED IN MG/24 HR) 0.1 mg/hr patch Place 0.1 mg onto the skin daily as needed (for angina).  09/06/14   Historical Provider, MD  pantoprazole (PROTONIX) 40 MG tablet Take 40 mg by mouth daily. 09/06/14   Historical Provider, MD   Physical Exam: Vitals:   01/29/17 1100 01/29/17 1120 01/29/17 1121 01/29/17 1408  BP: (!) 158/75 (!) 158/75  (!) 146/76  Pulse: 92 67  74  Resp: (!) 29 20  18   Temp:  97.6 F (36.4 C)    TempSrc:  Oral    SpO2: 94% 96%  95%  Weight:   69.9 kg (154 lb)   Height:   5\' 10"  (1.778 m)      General:  No apparent distress, [place]/AT, WDWN  Eyes: PERRL, EOMI, no scleral icterus, conjunctiva clear  ENT: moist oropharynx without exudate, TM's benign, dentition good  Neck: supple, no lymphadenopathy. No bruits or thyromegaly  Cardiovascular:  regular rate without MRG; 2+ peripheral pulses, no JVD, no peripheral edema  Respiratory: CTA biL, good air movement without wheezing, rhonchi or crackled. Respiratory effort normal  Abdomen: soft, non tender to palpation, positive bowel sounds, no guarding, no rebound  Skin: no rashes or lesions  Musculoskeletal: normal bulk and tone, no joint swelling  Psychiatric: normal mood and affect, orineted to person only  Neurologic: CN 2-12 grossly intact, Motor strength 5/5 in all 4 groups with symmetric DTR's and non-focal sensory exam.  Labs on Admission:  Basic Metabolic Panel:  Recent  Labs Lab 01/29/17 1113  NA 141  K 3.6  CL 107  CO2 28  GLUCOSE 92  BUN 25*  CREATININE 1.05  CALCIUM 9.1   Liver Function Tests:  Recent Labs Lab 01/29/17 1113  AST 22  ALT 20  ALKPHOS 77  BILITOT 1.3*  PROT 6.5  ALBUMIN 3.7   No results for input(s): LIPASE, AMYLASE in the last 168 hours.  Recent Labs Lab 01/29/17 1150  AMMONIA 26   CBC:  Recent Labs Lab 01/29/17 1113  WBC 5.3  NEUTROABS 2.8  HGB 14.1  HCT 41.0  MCV 92.4  PLT 139*   Cardiac Enzymes:  Recent Labs Lab 01/29/17 1113  TROPONINI <0.03    BNP (last 3 results)  Recent Labs  01/29/17 1113  BNP 99.0    ProBNP (last 3 results) No results for input(s): PROBNP in the last 8760 hours.  CBG:  Recent Labs Lab 01/29/17 1107  GLUCAP 95    Radiological Exams on Admission: Ct Head Wo Contrast  Result Date: 01/29/2017 CLINICAL DATA:  Altered mental status. EXAM: CT HEAD WITHOUT CONTRAST TECHNIQUE: Contiguous axial images were obtained from the base of the skull through the vertex without intravenous contrast. COMPARISON:  CT scan of November 14, 2016. FINDINGS: Brain: Mild diffuse cortical atrophy is noted. Mild chronic ischemic white matter disease is noted. No mass effect or midline shift is noted. Ventricular size is within normal limits. There is no evidence of mass lesion, hemorrhage or acute  infarction. Vascular: Atherosclerosis of carotid siphons is noted. Skull: Normal. Negative for fracture or focal lesion. Sinuses/Orbits: No acute finding. Other: None. IMPRESSION: Mild diffuse cortical atrophy. Mild chronic ischemic white matter disease. No acute intracranial abnormality seen. Electronically Signed   By: Lupita Raider, M.D.   On: 01/29/2017 12:03   Dg Chest Port 1 View  Result Date: 01/29/2017 CLINICAL DATA:  Acute onset of confusion and left arm twitching today. History of dementia. EXAM: PORTABLE CHEST 1 VIEW COMPARISON:  01/25/2016 and 09/04/2014. FINDINGS: 1127 hour. The left subclavian pacemaker leads appear unchanged within the right atrium and right ventricle. The heart size and mediastinal contours are stable status post CABG. The lungs are clear. There is no pleural effusion or pneumothorax. No acute osseous findings are seen. IMPRESSION: Stable postoperative chest.  No acute cardiopulmonary process seen. Electronically Signed   By: Carey Bullocks M.D.   On: 01/29/2017 11:56    EKG: Independently reviewed.  Assessment/Plan Principal Problem:   Seizure [facility]) Active Problems:   Alzheimer's disease   Coronary artery disease involving native heart with unstable angina pectoris (HCC)   Altered mental status   Will observe on floor with telemetry and neuro checks. IV Ativan as needed. Begin Keppra. Consult Neurology.  Diet: soft Fluids: NS@75  DVT Prophylaxis: SQ Heparin  Code Status: FULL  Family Communication: yes  Disposition Plan: home  Time spent: 50 min

## 2017-01-29 NOTE — ED Notes (Signed)
After given Ativan. Twitching has stopped at this time. Pt does appear more alert. Pt alert to self and place at this time. Alphonzo LemmingsMcshane, MD informed.

## 2017-01-29 NOTE — ED Triage Notes (Signed)
BIB EMS from [employer] while shopping with son, pt became confused and non verbal  son noticed twitching to left arm. Son reports he noticed same twitching last night. Pt alert to self responds to commands  History of Dementia.

## 2017-01-29 NOTE — Progress Notes (Signed)
Pt admitted from ED accompanied by son. Reports pt became unresponsive with son sitting pt on carton and calling for help with "jerking" of left arm. Pt alert/has delayed, verbal response with difficulty with memory recall. Son reports pt has "mild azheimers".  Seizure precautions initiated with pt and son educated. High fall risk education done/activated plan. Keppra po started. Updated needed meds today. Noted intermittent jerky, muscle twitching/spasms of LUE.

## 2017-01-29 NOTE — ED Notes (Signed)
Pt assisted with urinal. Sample sent to lab. Son remains at bedside. Denies any further twitching to left arm.

## 2017-01-29 NOTE — ED Provider Notes (Signed)
[facility] Emergency Department Provider Note  ____________________________________________   I have reviewed the triage vital signs and the nursing notes.   HISTORY  Chief Complaint Altered Mental Status    HPI Nathan Saunders is a 83 y.o. male who presents today with twitching in his mostly left upper extremity as well as altered mental status. He has had this before. The last time he had it he lasted for about 3 hours in this condition all workup was negative and he suddenly be stopped twitching and got better. This was several months ago. He has since had an outpatient workup which the family states includes an EEG which was allegedly negative. The patient was in his normal state of health this morning when he went for a walk and he incidentally came minimally responsive to verbal stimuli. He did not fall or pass out. He had twitching especially in his left upper extremity. This is exactly the same as his prior presentation. No fall no head injury no change in medication recently.   Past Medical History:  Diagnosis Date  . AICD (automatic cardioverter/defibrillator) present   . Alzheimer's disease 04/18/2015  . Anemia of chronic disease   . Atherosclerotic heart disease 1987   Multivessel disease, referred for CABG @[facility]  ([facility]  . Benign enlargement of prostate   . Cervical spondylosis   . Cervicogenic headache   . Cervicogenic headache   . Chronic atrial fibrillation (HCC)   . 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; PTCA of OM 2 ISR in Aug '02; recurrent ISR in OM 2 - PCI with Cypher DES in Oct '03; d. SVG-OM occluded - PCI to mCx & OM2 with Promus 3.0 mm x 8 mm stents; 12/'16 -Cardiolite - Negative  . Dyslipidemia   . Falls   . Gait disturbance   . History of complete heart block  September 2004   Dual-chamber put pacemaker placed [facility])  . Hypertension   . Insomnia   . Ischemic cardiomyopathy    (EF 55% by Cardiolite in 11/2015) - but Echo in 11/2015: EF ~40% with apical akinesis and global hypokinesis, moderate LA dilation, mild RA dilation  . Memory disturbance 09/24/2014   progressive; vascular dementia  . S/P CABG x 3 1987   LIMA-LAD, SVG-OM 2, SVG RCA    Patient Active Problem List   Diagnosis Date Noted  . Transient alteration of awareness 12/07/2016  . Unstable angina (HCC) 01/25/2016  . Hypertension 01/25/2016  . MR (mental retardation), moderate 01/25/2016  . Hypertensive urgency 01/25/2016  . Coronary artery disease involving native heart with unstable angina pectoris (HCC) 01/25/2016  . Dyslipidemia 01/25/2016  . Alzheimer's disease 04/18/2015  . Memory difficulty 09/24/2014    Past Surgical History:  Procedure Laterality Date  . CARDIAC CATHETERIZATION N/A 01/26/2016   Procedure: Left Heart Cath and Coronary Angiography;  Surgeon: Tonny BollmanMichael Cooper, MD;  [facility];  Service: Cardiovascular;  Laterality: N/A;  . CHOLECYSTECTOMY  2013   . CORONARY ARTERY BYPASS GRAFT     [facility]: LIMA-LAD, SVG OM and OM 2, SVG-PDA  . CORONARY STENT PLACEMENT  Jan 2001, Aug 2002, Oct 2003; 8/06   a. 1/01 -BMS to SVG-RCA and SVG-OM 2; b. ISR in SVG-OM2 PTCA; c. Re-ISR SVG-OM2 --> Cypher DES;; d. SVG-OM occluded - PCI to mCx &  OM2 with Promus 3.0 mm x 8 mm stents  . hernia repair Bilateral   . NM MYOVIEW LTD  December 2016   [facility]: Negative Cardiolite, normal wall motion =- EF ~55%  . PACEMAKER PLACEMENT  September 2004   Dual-chamber, ([facility]/ [place]) - Dr. Darrold Junker  . TRANSTHORACIC ECHOCARDIOGRAM  December 2016    [facility]: EF ~40% with apical akinesis and global hypokinesis, moderate LA dilation, mild RA dilation  . TRANSURETHRAL RESECTION OF PROSTATE      Prior to Admission medications   Medication Sig Start Date End Date  Taking? Authorizing Provider  amLODipine (NORVASC) 5 MG tablet Take 1 tablet (5 mg total) by mouth daily. 01/28/16   Bhavinkumar Bhagat, PA  apixaban (ELIQUIS) 5 MG TABS tablet Take 5 mg by mouth daily. 08/11/16   Historical Provider, MD  atorvastatin (LIPITOR) 40 MG tablet Take 40 mg by mouth daily.    Historical Provider, MD  donepezil (ARICEPT) 5 MG tablet Take 1 tablet (5 mg total) by mouth at bedtime. 05/25/16   York Spaniel, MD  donepezil (ARICEPT) 5 MG tablet Take 1 tablet (5 mg total) by mouth at bedtime. 08/19/16   York Spaniel, MD  finasteride (PROSCAR) 5 MG tablet Take 5 mg by mouth daily. 09/18/14   Historical Provider, MD  isosorbide mononitrate (IMDUR) 60 MG 24 hr tablet Take 60 mg by mouth daily. 09/24/14   Historical Provider, MD  levETIRAcetam (KEPPRA) 250 MG tablet Take 1 tablet (250 mg total) by mouth 2 (two) times daily. 12/09/16   York Spaniel, MD  meloxicam (MOBIC) 7.5 MG tablet Take 7.5 mg by mouth daily. 06/14/14   Historical Provider, MD  metoprolol succinate (TOPROL-XL) 100 MG 24 hr tablet Take 100 mg by mouth daily. 09/03/14   Historical Provider, MD  NAMENDA XR 28 MG CP24 24 hr capsule Take 28 mg by mouth daily. 04/01/15   Historical Provider, MD  nitroGLYCERIN (NITRODUR - DOSED IN MG/24 HR) 0.1 mg/hr patch Place 0.1 mg onto the skin daily as needed (for angina).  09/06/14   Historical Provider, MD  pantoprazole (PROTONIX) 40 MG tablet Take 40 mg by mouth daily. 09/06/14   Historical Provider, MD    Allergies Seroquel [quetiapine fumarate] and Serotonin reuptake inhibitors (ssris)  Family History  Problem Relation Age of Onset  . Heart attack Mother   . Heart attack Father   . Heart disease Brother   . Lung cancer Brother   . Lung cancer Sister   . Dementia Neg Hx     Social History Social History  Substance Use Topics  . Smoking status: Former Smoker    Packs/day: 0.50    Years: 30.00    Types: Cigarettes  . Smokeless tobacco: Never Used  . Alcohol use  No    Review of Systems, Obtained after resolution of ethyl status and the son Constitutional: No fever/chills Eyes: No visual changes. ENT: No sore throat. No stiff neck no neck pain Cardiovascular: Denies chest pain. Respiratory: Denies shortness of breath. Gastrointestinal:   no vomiting.  No diarrhea.  No constipation. Genitourinary: Negative for dysuria. Musculoskeletal: Negative lower extremity swelling Skin: Negative for rash. Neurological: Negative for severe headaches, focal weakness or numbness. 10-point ROS otherwise negative.  ____________________________________________   PHYSICAL EXAM:  VITAL SIGNS: ED Triage Vitals  Enc Vitals Group     BP 01/29/17 1120 (!) 158/75     Pulse Rate 01/29/17 1120 67     Resp 01/29/17 1120 20  Temp 01/29/17 1120 97.6 F (36.4 C)     Temp Source 01/29/17 1120 Oral     SpO2 01/29/17 1120 96 %     Weight 01/29/17 1121 154 lb (69.9 kg)     Height 01/29/17 1121 5\' 10"  (1.778 m)     Head Circumference --      Peak Flow --      Pain Score --      Pain Loc --      Pain Edu? --      Excl. in GC? --     Constitutional: Initially, patient is alert but will not anymore than his name. He is delayed in his responses but will follow commands with prompting. Eyes: Conjunctivae are normal. PERRL. EOMI. Head: Atraumatic. Nose: No congestion/rhinnorhea. Mouth/Throat: Mucous membranes are moist.  Oropharynx non-erythematous. Neck: No stridor.   Nontender with no meningismus Cardiovascular: Normal rate, regular rhythm. Grossly normal heart sounds.  Good peripheral circulation. Respiratory: Normal respiratory effort.  No retractions. Lungs CTAB. Abdominal: Soft and nontender. No distention. No guarding no rebound Back:  There is no focal tenderness or step off.  there is no midline tenderness there are no lesions noted. there is no CVA tenderness Musculoskeletal: No lower extremity tenderness, no upper extremity tenderness. No joint  effusions, no DVT signs strong distal pulses no edema Neurologic: He was twitching noted to the trapezius muscle on the left upper extremity generally. No other focal neurologic deficit is noted. Skin:  Skin is warm, dry and intact. No rash noted. Psychiatric: Mood and affect are normal. Speech and behavior are normal.  ____________________________________________   LABS (all labs ordered are listed, but only abnormal results are displayed)  Labs Reviewed  CBC WITH DIFFERENTIAL/PLATELET - Abnormal; Notable for the following:       Result Value   Platelets 139 (*)    All other components within normal limits  GLUCOSE, CAPILLARY  COMPREHENSIVE METABOLIC PANEL  ETHANOL  BRAIN NATRIURETIC PEPTIDE  AMMONIA  URINALYSIS, COMPLETE (UACMP) WITH MICROSCOPIC  TROPONIN I   ____________________________________________  EKG  I personally interpreted any EKGs ordered by me or triage Paced rhythm 89 bpm ____________________________________________  RADIOLOGY  I reviewed any imaging ordered by me or triage that were performed during my shift and, if possible, patient and/or family made aware of any abnormal findings. ____________________________________________   PROCEDURES  Procedure(s) performed: None  Procedures  Critical Care performed: None  ____________________________________________   INITIAL IMPRESSION / ASSESSMENT AND PLAN / ED COURSE  Pertinent labs & imaging results that were available during my care of the patient were reviewed by me and considered in my medical decision making (see chart for details).  Patient with rapid onset diminished mental status who has associated focal twitching with this. According to son there was a little twitching last night which was very brief. My concern was immediately for partial seizure. I did therefore give the patient will gram of Ativan and very rapidly thereafter the twitching stopped that he woke up and was much more oriented and  alert. Discussed with the on-call neurologist, Dr.Zeylikman, who agrees with management and feels the patient would benefit from Keppra twice a day from this. He agrees this is most likely seizure. We are waiting CT results etc. that if everything else appears normal as anticipated well, this will be our plan.     ____________________________________________   FINAL CLINICAL IMPRESSION(S) / ED DIAGNOSES  Final diagnoses:  Altered mental status      This chart  was dictated using voice recognition software.  Despite best efforts to proofread,  errors can occur which can change meaning.      Jeanmarie Plant, MD 01/29/17 [phone]

## 2017-01-30 DIAGNOSIS — D696 Thrombocytopenia, unspecified: Secondary | ICD-10-CM

## 2017-01-30 DIAGNOSIS — R251 Tremor, unspecified: Secondary | ICD-10-CM | POA: Diagnosis not present

## 2017-01-30 DIAGNOSIS — R079 Chest pain, unspecified: Secondary | ICD-10-CM

## 2017-01-30 DIAGNOSIS — D649 Anemia, unspecified: Secondary | ICD-10-CM

## 2017-01-30 LAB — CBC
HCT: 33.5 % — ABNORMAL LOW (ref 40.0–52.0)
HEMOGLOBIN: 12.2 g/dL — AB (ref 13.0–18.0)
MCH: 33.1 pg (ref 26.0–34.0)
MCHC: 36.3 g/dL — ABNORMAL HIGH (ref 32.0–36.0)
MCV: 91.3 fL (ref 80.0–100.0)
PLATELETS: 141 10*3/uL — AB (ref 150–440)
RBC: 3.67 MIL/uL — AB (ref 4.40–5.90)
RDW: 13.7 % (ref 11.5–14.5)
WBC: 5.7 10*3/uL (ref 3.8–10.6)

## 2017-01-30 LAB — COMPREHENSIVE METABOLIC PANEL
ALK PHOS: 64 U/L (ref 38–126)
ALT: 16 U/L — AB (ref 17–63)
AST: 17 U/L (ref 15–41)
Albumin: 3 g/dL — ABNORMAL LOW (ref 3.5–5.0)
Anion gap: 3 — ABNORMAL LOW (ref 5–15)
BUN: 20 mg/dL (ref 6–20)
CALCIUM: 8.2 mg/dL — AB (ref 8.9–10.3)
CO2: 27 mmol/L (ref 22–32)
Chloride: 110 mmol/L (ref 101–111)
Creatinine, Ser: 0.9 mg/dL (ref 0.61–1.24)
Glucose, Bld: 81 mg/dL (ref 65–99)
Potassium: 3.6 mmol/L (ref 3.5–5.1)
Sodium: 140 mmol/L (ref 135–145)
TOTAL PROTEIN: 5.3 g/dL — AB (ref 6.5–8.1)
Total Bilirubin: 1.1 mg/dL (ref 0.3–1.2)

## 2017-01-30 MED ORDER — ASPIRIN EC 81 MG PO TBEC
81.0000 mg | DELAYED_RELEASE_TABLET | Freq: Every day | ORAL | Status: DC
Start: 2017-01-30 — End: 2017-01-30

## 2017-01-30 MED ORDER — ASPIRIN 81 MG PO TBEC: 81.0000 mg | DELAYED_RELEASE_TABLET | Freq: Every day | ORAL | 6 refills | Status: AC

## 2017-01-30 NOTE — Progress Notes (Signed)
No jerky movements noted until discussed with pt; then started having jerky movements of left shoulder. Pt requested to hold arm still with jerking type movement stopping.  MD aware.

## 2017-01-30 NOTE — Progress Notes (Signed)
Neuro consult done. Son in with pt. No jerky movments observed during discharge. Oral and written AVS discharge instructions done with son/pt with repeat back done. Discharge home with son to self care.

## 2017-01-30 NOTE — Discharge Summary (Signed)
[facility] - [place] at [facility]   PATIENT NAME: Nathan Saunders    MR#:  [id number]  DATE OF BIRTH:  [birth year]  DATE OF ADMISSION:  01/29/2017 ADMITTING PHYSICIAN: Marguarite Arbour, MD  DATE OF DISCHARGE: 01/30/2017 11:57 AM  PRIMARY CARE PHYSICIAN: SPARKS,JEFFREY D, MD     ADMISSION DIAGNOSIS:  Altered mental status [R41.82] Altered mental status, unspecified altered mental status type [R41.82]  DISCHARGE DIAGNOSIS:  Principal Problem:   Tremor Active Problems:   Alzheimer's disease   Altered mental status   Chest pain   Coronary artery disease involving native heart with unstable angina pectoris (HCC)   Anemia   Thrombocytopenia (HCC)   SECONDARY DIAGNOSIS:   Past Medical History:  Diagnosis Date  . AICD (automatic cardioverter/defibrillator) present   . Alzheimer's disease 04/18/2015  . Anemia of chronic disease   . Atherosclerotic heart disease 1987   Multivessel disease, referred for CABG @[facility]  (Cardilogist - Dr. Gwen Pounds, [facility])  . Benign enlargement of prostate   . Cervical spondylosis   . Cervicogenic headache   . Cervicogenic headache   . Chronic atrial fibrillation (HCC)   . 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 PCI to [facility] and SVG OM and OM in Jan '01; PTCA of OM 2 ISR in Aug '02; recurrent ISR in OM 2 - PCI with Cypher DES in Oct '03; d. SVG-OM occluded - PCI to mCx & OM2 with Promus 3.0 mm x 8 mm stents; 12/'16 -Cardiolite - Negative  . Dyslipidemia   . Falls   . Gait disturbance   . History of complete heart block September 2004   Dual-chamber put pacemaker placed [facility])  . Hypertension   . Insomnia   . Ischemic cardiomyopathy    (EF 55% by Cardiolite in 11/2015) - but Echo in 11/2015: EF ~40% with apical akinesis and global hypokinesis, moderate LA dilation, mild RA dilation  . Memory disturbance 09/24/2014   progressive;  vascular dementia  . S/P CABG x 3 1987   LIMA-LAD, SVG-OM 2, SVG RCA    .pro HOSPITAL COURSE:   The patient is a 83 year old Caucasian male with past medical history significant for history of coronary artery disease, hyperlipidemia, hypertension, dementia, coronary artery bypass grafting, who presents to the hospital with complaints of chest pains, tremors, also some confusion. On arrival to emergency room. His head CT, labs, chest x-ray was unremarkable. He was admitted recently for very similar symptoms, at that time. Workup included electroencephalogram, unremarkable, neurology consultation. Due to tremor. The. Patient was admitted. He was seen by neurologist, who felt that twitching, shaking was not seizure, with possible muscle spasm, he recommended no antiepileptic medications, he felt that his symptoms are  psychogenic, and that was explained to the family. He recommended psychiatry follow-up as outpatient. Discussion by problem: #1. Tremor, likely psychogenic, patient is able to control it, no antibiotics were recommended by neurologist, but possibly outpatient psychiatric follow-up #2. Chest pain, troponin was negative, patient denied recurrent chest pains, patient's son was recommended for patient to follow up with cardiologist as outpatient #3 anemia, follow as outpatient, likely dehydration related #4 thrombocytopenia, follow-up as outpatient, stable  DISCHARGE CONDITIONS:   Stable  CONSULTS OBTAINED:  Treatment Team:  Thana Farr, MD  DRUG ALLERGIES:   Allergies  Allergen Reactions  . Seroquel [Quetiapine Fumarate] Other (See Comments)    Nightmares  . Serotonin Reuptake Inhibitors (Ssris) Other (See  Comments)    Nightmares? Maybe confused for seroquel interaction (not SSRI)    DISCHARGE MEDICATIONS:   Discharge Medication List as of 01/30/2017 11:22 AM    START taking these medications   Details  aspirin EC 81 MG EC tablet Take 1 tablet (81 mg total) by mouth  daily., Starting Sun 01/30/2017, Normal      CONTINUE these medications which have NOT CHANGED   Details  amLODipine (NORVASC) 5 MG tablet Take 1 tablet (5 mg total) by mouth daily., Starting Wed 01/28/2016, Normal    apixaban (ELIQUIS) 5 MG TABS tablet Take 5 mg by mouth daily., Starting Wed 08/11/2016, Historical Med    atorvastatin (LIPITOR) 40 MG tablet Take 40 mg by mouth daily., Until Discontinued, Historical Med    donepezil (ARICEPT) 5 MG tablet Take 1 tablet (5 mg total) by mouth at bedtime., Starting Thu 08/19/2016, Normal    finasteride (PROSCAR) 5 MG tablet Take 5 mg by mouth daily., Starting 09/18/2014, Until Discontinued, Historical Med    isosorbide mononitrate (IMDUR) 60 MG 24 hr tablet Take 60 mg by mouth daily., Starting 09/24/2014, Until Discontinued, Historical Med    metoprolol succinate (TOPROL-XL) 100 MG 24 hr tablet Take 100 mg by mouth daily., Starting 09/03/2014, Until Discontinued, Historical Med    NAMENDA XR 28 MG CP24 24 hr capsule Take 28 mg by mouth daily., Starting 04/01/2015, Until Discontinued, Historical Med    nitroGLYCERIN (NITRODUR - DOSED IN MG/24 HR) 0.1 mg/hr patch Place 0.1 mg onto the skin daily as needed (for angina). , Starting 09/06/2014, Until Discontinued, Historical Med    pantoprazole (PROTONIX) 40 MG tablet Take 40 mg by mouth daily., Starting 09/06/2014, Until Discontinued, Historical Med      STOP taking these medications     levETIRAcetam (KEPPRA) 250 MG tablet      meloxicam (MOBIC) 7.5 MG tablet          DISCHARGE INSTRUCTIONS:    The patient is to follow-up with primary care physician, psychiatrist as outpatient  If you experience worsening of your admission symptoms, develop shortness of breath, life threatening emergency, suicidal or homicidal thoughts you must seek medical attention immediately by calling 911 or calling your MD immediately  if symptoms less severe.  You Must read complete instructions/literature along with all  the possible adverse reactions/side effects for all the Medicines you take and that have been prescribed to you. Take any new Medicines after you have completely understood and accept all the possible adverse reactions/side effects.   Please note  You were cared for by a hospitalist during your hospital stay. If you have any questions about your discharge medications or the care you received while you were in the hospital after you are discharged, you can call the unit and asked to speak with the hospitalist on call if the hospitalist that took care of you is not available. Once you are discharged, your primary care physician will handle any further medical issues. Please note that NO REFILLS for any discharge medications will be authorized once you are discharged, as it is imperative that you return to your primary care physician (or establish a relationship with a primary care physician if you do not have one) for your aftercare needs so that they can reassess your need for medications and monitor your lab values.    Today   CHIEF COMPLAINT:   Chief Complaint  Patient presents with  . Altered Mental Status    HISTORY OF PRESENT ILLNESS:  Roger ShelterGordon  Emond  is a 83 y.o. male with a known history of coronary artery disease, hyperlipidemia, hypertension, dementia, coronary artery bypass grafting, who presents to the hospital with complaints of chest pains, tremors, also some confusion. On arrival to emergency room. His head CT, labs, chest x-ray was unremarkable. He was admitted recently for very similar symptoms, at that time. Workup included electroencephalogram, unremarkable, neurology consultation. Due to tremor. The. Patient was admitted. He was seen by neurologist, who felt that twitching, shaking was not seizure, with possible muscle spasm, he recommended no antiepileptic medications, he felt that his symptoms are  psychogenic, and that was explained to the family. He recommended psychiatry  follow-up as outpatient. Discussion by problem: #1. Tremor, likely psychogenic, patient is able to control it, no antibiotics were recommended by neurologist, but possibly outpatient psychiatric follow-up #2. Chest pain, troponin was negative, patient denied recurrent chest pains, patient's son was recommended for patient to follow up with cardiologist as outpatient #3 anemia, follow as outpatient, likely dehydration related #4 thrombocytopenia, follow-up as outpatient, stable   VITAL SIGNS:  Blood pressure 133/62, pulse 82, temperature 97.9 F (36.6 C), temperature source Oral, resp. rate 20, height 5\' 10"  (1.778 m), weight 64.3 kg (141 lb 11.2 oz), SpO2 94 %.  I/O:   Intake/Output Summary (Last 24 hours) at 01/30/17 1549 Last data filed at 01/30/17 1100  Gross per 24 hour  Intake          2111.75 ml  Output              825 ml  Net          1286.75 ml    PHYSICAL EXAMINATION:  GENERAL:  83 y.o.-year-old patient lying in the bed with no acute distress.  EYES: Pupils equal, round, reactive to light and accommodation. No scleral icterus. Extraocular muscles intact.  HEENT: Head atraumatic, normocephalic. Oropharynx and nasopharynx clear.  NECK:  Supple, no jugular venous distention. No thyroid enlargement, no tenderness.  LUNGS: Normal breath sounds bilaterally, no wheezing, rales,rhonchi or crepitation. No use of accessory muscles of respiration.  CARDIOVASCULAR: S1, S2 normal. No murmurs, rubs, or gallops.  ABDOMEN: Soft, non-tender, non-distended. Bowel sounds present. No organomegaly or mass.  EXTREMITIES: No pedal edema, cyanosis, or clubbing.  NEUROLOGIC: Cranial nerves II through XII are intact. Muscle strength 5/5 in all extremities. Sensation intact. Gait not checked.  PSYCHIATRIC: The patient is alert and oriented x 3.  SKIN: No obvious rash, lesion, or ulcer.   DATA REVIEW:   CBC  Recent Labs Lab 01/30/17 0439  WBC 5.7  HGB 12.2*  HCT 33.5*  PLT 141*     Chemistries   Recent Labs Lab 01/30/17 0439  NA 140  K 3.6  CL 110  CO2 27  GLUCOSE 81  BUN 20  CREATININE 0.90  CALCIUM 8.2*  AST 17  ALT 16*  ALKPHOS 64  BILITOT 1.1    Cardiac Enzymes  Recent Labs Lab 01/29/17 1113  TROPONINI <0.03    Microbiology Results  Results for orders placed or performed during the hospital encounter of 01/25/16  MRSA PCR Screening     Status: None   Collection Time: 01/25/16 10:59 PM  Result Value Ref Range Status   MRSA by PCR NEGATIVE NEGATIVE Final    Comment:        The GeneXpert MRSA Assay (FDA approved for NASAL specimens only), is one component of a comprehensive MRSA colonization surveillance program. It is not intended to diagnose MRSA infection nor  to guide or monitor treatment for MRSA infections.     RADIOLOGY:  Ct Head Wo Contrast  Result Date: 01/29/2017 CLINICAL DATA:  Altered mental status. EXAM: CT HEAD WITHOUT CONTRAST TECHNIQUE: Contiguous axial images were obtained from the base of the skull through the vertex without intravenous contrast. COMPARISON:  CT scan of November 14, 2016. FINDINGS: Brain: Mild diffuse cortical atrophy is noted. Mild chronic ischemic white matter disease is noted. No mass effect or midline shift is noted. Ventricular size is within normal limits. There is no evidence of mass lesion, hemorrhage or acute infarction. Vascular: Atherosclerosis of carotid siphons is noted. Skull: Normal. Negative for fracture or focal lesion. Sinuses/Orbits: No acute finding. Other: None. IMPRESSION: Mild diffuse cortical atrophy. Mild chronic ischemic white matter disease. No acute intracranial abnormality seen. Electronically Signed   By: Lupita Raider, M.D.   On: 01/29/2017 12:03   Dg Chest Port 1 View  Result Date: 01/29/2017 CLINICAL DATA:  Acute onset of confusion and left arm twitching today. History of dementia. EXAM: PORTABLE CHEST 1 VIEW COMPARISON:  01/25/2016 and 09/04/2014. FINDINGS: 1127 hour.  The left subclavian pacemaker leads appear unchanged within the right atrium and right ventricle. The heart size and mediastinal contours are stable status post CABG. The lungs are clear. There is no pleural effusion or pneumothorax. No acute osseous findings are seen. IMPRESSION: Stable postoperative chest.  No acute cardiopulmonary process seen. Electronically Signed   By: Carey Bullocks M.D.   On: 01/29/2017 11:56    EKG:   Orders placed or performed during the hospital encounter of 01/29/17  . ED EKG  . ED EKG      Management plans discussed with the patient, family and they are in agreement.  CODE STATUS:  Code Status History    Date Active Date Inactive Code Status Order ID Comments User Context   01/29/2017  4:29 PM 01/30/2017  3:02 PM Full Code [id number]  Marguarite Arbour, MD Inpatient   01/26/2016  6:18 PM 01/28/2016  4:14 PM Full Code [id number]  Tonny Bollman, MD Inpatient   01/25/2016 10:58 PM 01/26/2016  6:18 PM Full Code [id number]  Joellyn Rued, MD Inpatient    Advance Directive Documentation   Flowsheet Row Most Recent Value  Type of Advance Directive  Healthcare Power of Attorney, Living will  Pre-existing out of facility DNR order (yellow form or pink MOST form)  No data  "MOST" Form in Place?  No data      TOTAL TIME TAKING CARE OF THIS PATIENT: 40 minutes.   Discussed with patient's son and neurologist, all questions were answered Michio Thier M.D on 01/30/2017 at 3:49 PM  Between 7am to 6pm - Pager - [phone]  After 6pm go to [email or web address] - password [email or web address] [facility]  [place]  Hospitalists  Office  [phone]  CC: Primary care physician; Marguarite Arbour, MD

## 2017-01-30 NOTE — Consult Note (Signed)
Reason for Consult:L arm tremors Referring Physician: Dr. Judithann Sheen.   CC: L arm tremors   HPI: Nathan Saunders is an 83 y.o. male  has a past medical history significant for CAD and mixed Alzheimer's/vascilar dementia now with acute confusion and "twitching" of LUE. Similar presentation 1-2 month ago and w/u including EEG.    Past Medical History:  Diagnosis Date  . AICD (automatic cardioverter/defibrillator) present   . Alzheimer's disease 04/18/2015  . Anemia of chronic disease   . Atherosclerotic heart disease 1987   Multivessel disease, referred for CABG @[facility]  (Cardilogist - Dr. Gwen Pounds, [facility])  . Benign enlargement of prostate   . Cervical spondylosis   . Cervicogenic headache   . Cervicogenic headache   . Chronic atrial fibrillation (HCC)   . 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 PCI to [facility] and SVG OM and OM in Jan '01; PTCA of OM 2 ISR in Aug '02; recurrent ISR in OM 2 - PCI with Cypher DES in Oct '03; d. SVG-OM occluded - PCI to mCx & OM2 with Promus 3.0 mm x 8 mm stents; 12/'16 -Cardiolite - Negative  . Dyslipidemia   . Falls   . Gait disturbance   . History of complete heart block September 2004   Dual-chamber put pacemaker placed [facility])  . Hypertension   . Insomnia   . Ischemic cardiomyopathy    (EF 55% by Cardiolite in 11/2015) - but Echo in 11/2015: EF ~40% with apical akinesis and global hypokinesis, moderate LA dilation, mild RA dilation  . Memory disturbance 09/24/2014   progressive; vascular dementia  . S/P CABG x 3 1987   LIMA-LAD, SVG-OM 2, SVG RCA    Past Surgical History:  Procedure Laterality Date  . CARDIAC CATHETERIZATION N/A 01/26/2016   Procedure: Left Heart Cath and Coronary Angiography;  Surgeon: Tonny Bollman, MD;  Location: [facility] INVASIVE CV LAB;  Service: Cardiovascular;  Laterality: N/A;  . CHOLECYSTECTOMY  2013   . CORONARY ARTERY BYPASS GRAFT      [facility]: LIMA-LAD, SVG OM and OM 2, SVG-PDA  . CORONARY STENT PLACEMENT  Jan 2001, Aug 2002, Oct 2003; 8/06   a. 1/01 -BMS to SVG-RCA and SVG-OM 2; b. ISR in SVG-OM2 PTCA; c. Re-ISR SVG-OM2 --> Cypher DES;; d. SVG-OM occluded - PCI to mCx & OM2 with Promus 3.0 mm x 8 mm stents  . hernia repair Bilateral   . NM MYOVIEW LTD  December 2016   [facility]: Negative Cardiolite, normal wall motion =- EF ~55%  . PACEMAKER PLACEMENT  September 2004   Dual-chamber, ([facility]/ [place]) - Dr. Darrold Junker  . TRANSTHORACIC ECHOCARDIOGRAM  December 2016    [facility]: EF ~40% with apical akinesis and global hypokinesis, moderate LA dilation, mild RA dilation  . TRANSURETHRAL RESECTION OF PROSTATE      Family History  Problem Relation Age of Onset  . Heart attack Mother   . Heart attack Father   . Heart disease Brother   . Lung cancer Brother   . Lung cancer Sister   . Dementia Neg Hx     Social History:  reports that he has quit smoking. His smoking use included Cigarettes. He has a 15.00 pack-year smoking history. He has never used smokeless tobacco. He reports that he does not drink alcohol or use drugs.  Allergies  Allergen Reactions  . Seroquel [Quetiapine Fumarate] Other (See Comments)    Nightmares  .  Serotonin Reuptake Inhibitors (Ssris) Other (See Comments)    Nightmares? Maybe confused for seroquel interaction (not SSRI)    Medications: I have reviewed the patient's current medications.  ROS: Confusion and disorientation   Physical Examination: Blood pressure 133/62, pulse 82, temperature 97.9 F (36.6 C), temperature source Oral, resp. rate 20, height 5\' 10"  (1.778 m), weight 64.3 kg (141 lb 11.2 oz), SpO2 94 %.   Neurological Examination Mental Status: Alert, oriented, thought content appropriate.  Speech fluent without evidence of aphasia.  Able to follow 3 step commands without difficulty. Cranial Nerves: II: Discs flat bilaterally; Visual fields grossly normal, pupils  equal, round, reactive to light and accommodation III,IV, VI: ptosis not present, extra-ocular motions intact bilaterally V,VII: smile symmetric, facial light touch sensation normal bilaterally VIII: hearing normal bilaterally IX,X: gag reflex present XI: bilateral shoulder shrug XII: midline tongue extension Motor: Right : Upper extremity   5/5    Left:     Upper extremity   5/5  Lower extremity   5/5     Lower extremity   5/5 Tone and bulk:normal tone throughout; no atrophy noted Sensory: Pinprick and light touch intact throughout, bilaterally Deep Tendon Reflexes: 2+ and symmetric throughout Plantars: Right: downgoing   Left: downgoing Cerebellar: normal finger-to-nose, normal rapid alternating movements and normal heel-to-shin test Gait: not tested      Laboratory Studies:   Basic Metabolic Panel:  Recent Labs Lab 01/29/17 1113 01/30/17 0439  NA 141 140  K 3.6 3.6  CL 107 110  CO2 28 27  GLUCOSE 92 81  BUN 25* 20  CREATININE 1.05 0.90  CALCIUM 9.1 8.2*    Liver Function Tests:  Recent Labs Lab 01/29/17 1113 01/30/17 0439  AST 22 17  ALT 20 16*  ALKPHOS 77 64  BILITOT 1.3* 1.1  PROT 6.5 5.3*  ALBUMIN 3.7 3.0*   No results for input(s): LIPASE, AMYLASE in the last 168 hours.  Recent Labs Lab 01/29/17 1150  AMMONIA 26    CBC:  Recent Labs Lab 01/29/17 1113 01/30/17 0439  WBC 5.3 5.7  NEUTROABS 2.8  --   HGB 14.1 12.2*  HCT 41.0 33.5*  MCV 92.4 91.3  PLT 139* 141*    Cardiac Enzymes:  Recent Labs Lab 01/29/17 1113  TROPONINI <0.03    BNP: Invalid input(s): POCBNP  CBG:  Recent Labs Lab 01/29/17 1107  GLUCAP 95    Microbiology: Results for orders placed or performed during the hospital encounter of 01/25/16  MRSA PCR Screening     Status: None   Collection Time: 01/25/16 10:59 PM  Result Value Ref Range Status   MRSA by PCR NEGATIVE NEGATIVE Final    Comment:        The GeneXpert MRSA Assay (FDA approved for NASAL  specimens only), is one component of a comprehensive MRSA colonization surveillance program. It is not intended to diagnose MRSA infection nor to guide or monitor treatment for MRSA infections.     Coagulation Studies: No results for input(s): LABPROT, INR in the last 72 hours.  Urinalysis:  Recent Labs Lab 01/29/17 1305  COLORURINE YELLOW*  LABSPEC 1.010  PHURINE 6.0  GLUCOSEU NEGATIVE  HGBUR SMALL*  BILIRUBINUR NEGATIVE  KETONESUR NEGATIVE  PROTEINUR NEGATIVE  NITRITE NEGATIVE  LEUKOCYTESUR NEGATIVE    Lipid Panel:     Component Value Date/Time   CHOL 106 01/25/2016 2332   TRIG 148 01/25/2016 2332   HDL 26 (L) 01/25/2016 2332   CHOLHDL 4.1 01/25/2016 2332  VLDL 30 01/25/2016 2332   LDLCALC 50 01/25/2016 2332    HgbA1C:  Lab Results  Component Value Date   HGBA1C 5.2 01/25/2016    Urine Drug Screen:  No results found for: LABOPIA, COCAINSCRNUR, LABBENZ, AMPHETMU, THCU, LABBARB  Alcohol Level:  Recent Labs Lab 01/29/17 1113  ETH <5     Imaging: Ct Head Wo Contrast  Result Date: 01/29/2017 CLINICAL DATA:  Altered mental status. EXAM: CT HEAD WITHOUT CONTRAST TECHNIQUE: Contiguous axial images were obtained from the base of the skull through the vertex without intravenous contrast. COMPARISON:  CT scan of November 14, 2016. FINDINGS: Brain: Mild diffuse cortical atrophy is noted. Mild chronic ischemic white matter disease is noted. No mass effect or midline shift is noted. Ventricular size is within normal limits. There is no evidence of mass lesion, hemorrhage or acute infarction. Vascular: Atherosclerosis of carotid siphons is noted. Skull: Normal. Negative for fracture or focal lesion. Sinuses/Orbits: No acute finding. Other: None. IMPRESSION: Mild diffuse cortical atrophy. Mild chronic ischemic white matter disease. No acute intracranial abnormality seen. Electronically Signed   By: Lupita Raider, M.D.   On: 01/29/2017 12:03   Dg Chest Port 1  View  Result Date: 01/29/2017 CLINICAL DATA:  Acute onset of confusion and left arm twitching today. History of dementia. EXAM: PORTABLE CHEST 1 VIEW COMPARISON:  01/25/2016 and 09/04/2014. FINDINGS: 1127 hour. The left subclavian pacemaker leads appear unchanged within the right atrium and right ventricle. The heart size and mediastinal contours are stable status post CABG. The lungs are clear. There is no pleural effusion or pneumothorax. No acute osseous findings are seen. IMPRESSION: Stable postoperative chest.  No acute cardiopulmonary process seen. Electronically Signed   By: Carey Bullocks M.D.   On: 01/29/2017 11:56     Assessment/Plan:  83 y.o. male  has a past medical history significant for CAD and mixed Alzheimer's/vascilar dementia now with acute confusion and "twitching" of LUE. Similar presentation 1-2 month ago and w/u including EEG.    The twitching/shaking is not seizure related. Completely distractible and possible muscle spasm.    No anti epileptics This is possible psychogenic  Explained to family Possibly psychiatry at out pt.   D/c   01/30/2017, 1:42 PM

## 2017-02-07 ENCOUNTER — Ambulatory Visit (INDEPENDENT_AMBULATORY_CARE_PROVIDER_SITE_OTHER): Payer: Medicare HMO | Admitting: Adult Health

## 2017-02-07 ENCOUNTER — Encounter: Payer: Self-pay | Admitting: Adult Health

## 2017-02-07 VITALS — BP 130/71 | HR 92 | Ht 70.0 in | Wt 143.8 lb

## 2017-02-07 DIAGNOSIS — R253 Fasciculation: Secondary | ICD-10-CM

## 2017-02-07 DIAGNOSIS — R413 Other amnesia: Secondary | ICD-10-CM | POA: Diagnosis not present

## 2017-02-07 DIAGNOSIS — F419 Anxiety disorder, unspecified: Secondary | ICD-10-CM | POA: Diagnosis not present

## 2017-02-07 NOTE — Progress Notes (Signed)
I have read the note, and I agree with the clinical assessment and plan.  WILLIS,CHARLES KEITH   

## 2017-02-07 NOTE — Patient Instructions (Signed)
Continue Aricept and Namenda Schedule appointment with Dr. Donell BeersPlovsky [phone]

## 2017-02-07 NOTE — Progress Notes (Signed)
PATIENT: Nathan Saunders DOB: 17-Jul-1932  REASON FOR VISIT: follow up HISTORY FROM: patient  HISTORY OF PRESENT ILLNESS: Nathan Saunders is an 83 year old male with a history of episodes of jerkiness, gait instability and memory disturbance. He returns today for follow-up. His son is with him today. He reports that the patient had an episode last weekend. He states it began with chest pain then the patient began jerking and according to the son he was unresponsive. He did go to the emergency room. He was evaluated by the neurologist there -Dr. Thad Ranger. It was felt that the jerking episodes are psychogenic. They recommended that the patient follow up with a psychiatrist. The patient has had an EEG in the past that was unremarkable. The son reports that when he came off gabapentin it appeared that the jerking improved some but then he had this episode is past weekend. They have not seen a psychiatrist. The son does think some of this may be anxiety. The patient lives with his son. He is able to complete all ADLs independently. His son manages this finances and prepares all meals. Patient returns today for an evaluation.  HISTORY 12/09/16: Nathan Saunders is an 83 year old left-handed white male with a history of episodes of jerkiness and gait instability. The patient had onset of another event last evening that has carried into today, the family claims it will come and go during the day. The patient appears to have twitching as of the left shoulder primarily, occasionally on the right, and his walking has become ataxic. With this most recent episode he has not had as much confusion. The patient has been set for an EEG study, but this has not yet been done. He returns for an urgent reevaluation.  REVIEW OF SYSTEMS: Out of a complete 14 system review of symptoms, the patient complains only of the following symptoms, and all other reviewed systems are negative.  See history of present  illness  ALLERGIES: Allergies  Allergen Reactions  . Seroquel [Quetiapine Fumarate] Other (See Comments)    Nightmares  . Serotonin Reuptake Inhibitors (Ssris) Other (See Comments)    Nightmares? Maybe confused for seroquel interaction (not SSRI)    HOME MEDICATIONS: Outpatient Medications Prior to Visit  Medication Sig Dispense Refill  . amLODipine (NORVASC) 5 MG tablet Take 1 tablet (5 mg total) by mouth daily. 30 tablet 2  . apixaban (ELIQUIS) 5 MG TABS tablet Take 5 mg by mouth daily.    Marland Kitchen aspirin EC 81 MG EC tablet Take 1 tablet (81 mg total) by mouth daily. 30 tablet 6  . atorvastatin (LIPITOR) 40 MG tablet Take 40 mg by mouth daily.    Marland Kitchen donepezil (ARICEPT) 5 MG tablet Take 1 tablet (5 mg total) by mouth at bedtime. 90 tablet 3  . finasteride (PROSCAR) 5 MG tablet Take 5 mg by mouth daily.    . isosorbide mononitrate (IMDUR) 60 MG 24 hr tablet Take 60 mg by mouth daily.    . metoprolol succinate (TOPROL-XL) 100 MG 24 hr tablet Take 100 mg by mouth daily.    Marland Kitchen NAMENDA XR 28 MG CP24 24 hr capsule Take 28 mg by mouth daily.    . nitroGLYCERIN (NITRODUR - DOSED IN MG/24 HR) 0.1 mg/hr patch Place 0.1 mg onto the skin daily as needed (for angina).     . pantoprazole (PROTONIX) 40 MG tablet Take 40 mg by mouth daily.     No facility-administered medications prior to visit.  PAST MEDICAL HISTORY: Past Medical History:  Diagnosis Date  . AICD (automatic cardioverter/defibrillator) present   . Alzheimer's disease 04/18/2015  . Anemia of chronic disease   . Atherosclerotic heart disease 1987   Multivessel disease, referred for CABG @[facility]  ([facility]  . Benign enlargement of prostate   . Cervical spondylosis   . Cervicogenic headache   . Cervicogenic headache   . Chronic atrial fibrillation (HCC)   . 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  [facility]G OM and OM in Jan '01; PTCA of OM 2 ISR in Aug '02; recurrent ISR in OM 2 - PCI with Cypher DES in Oct '03; d. SVG-OM occluded - PCI to mCx & OM2 with Promus 3.0 mm x 8 mm stents; 12/'16 -Cardiolite - Negative  . Dyslipidemia   . Falls   . Gait disturbance   . History of complete heart block September 2004   Dual-chamber put pacemaker placed [facility])  . Hypertension   . Insomnia   . Ischemic cardiomyopathy    (EF 55% by Cardiolite in 11/2015) - but Echo in 11/2015: EF ~40% with apical akinesis and global hypokinesis, moderate LA dilation, mild RA dilation  . Memory disturbance 09/24/2014   progressive; vascular dementia  . S/P CABG x 3 1987   LIMA-LAD, SVG-OM 2, SVG RCA    PAST SURGICAL HISTORY: Past Surgical History:  Procedure Laterality Date  . CARDIAC CATHETERIZATION N/A 01/26/2016   Procedure: Left Heart Cath and Coronary Angiography;  Surgeon: Tonny BollmanMichael Cooper, MD;  [facility];  Service: Cardiovascular;  Laterality: N/A;  . CHOLECYSTECTOMY  2013   . CORONARY ARTERY BYPASS GRAFT     [facility]: LIMA-LAD, SVG OM and OM 2, SVG-PDA  . CORONARY STENT PLACEMENT  Jan 2001, Aug 2002, Oct 2003; 8/06   a. 1/01 -BMS to SVG-RCA and SVG-OM 2; b. ISR in SVG-OM2 PTCA; c. Re-ISR SVG-OM2 --> Cypher DES;; d. SVG-OM occluded - PCI to mCx & OM2 with Promus 3.0 mm x 8 mm stents  . hernia repair Bilateral   . NM MYOVIEW LTD  December 2016   [facility]: Negative Cardiolite, normal wall motion =- EF ~55%  . PACEMAKER PLACEMENT  September 2004   [facility] - Dr. Darrold JunkerParaschos  . TRANSTHORACIC ECHOCARDIOGRAM  December 2016    [facility]: EF ~40% with apical akinesis and global hypokinesis, moderate LA dilation, mild RA dilation  . TRANSURETHRAL RESECTION OF PROSTATE      FAMILY HISTORY: Family History  Problem Relation Age of Onset  . Heart attack Mother   . Heart attack Father   . Heart disease Brother   . Lung cancer Brother   . Lung cancer Sister   .  Dementia Neg Hx     SOCIAL HISTORY: Social History   Social History  . Marital status: Widowed    Spouse name: N/A  . Number of children: 1  . Years of education: hs   Occupational History  . Retired    Social History Main Topics  . Smoking status: Former Smoker    Packs/day: 0.50    Years: 30.00    Types: Cigarettes  . Smokeless tobacco: Never Used  . Alcohol use No  . Drug use: No  . Sexual activity: Not on file   Other Topics Concern  . Not on file   Social History Narrative  Patient is left handed.   Patient drinks 3 cups of caffeine daily.      PHYSICAL EXAM  Vitals:   02/07/17 0805  BP: 130/71  Pulse: 92  Weight: 143 lb 12.8 oz (65.2 kg)  Height: 5\' 10"  (1.778 m)   Body mass index is 20.63 kg/m.   MMSE - Mini Mental State Exam 02/07/2017 12/07/2016 08/04/2016  Orientation to time 1 0 0  Orientation to Place 2 3 3   Registration 3 3 3   Attention/ Calculation 0 0 0  Recall 0 1 0  Language- name 2 objects 1 2 1   Language- repeat 0 0 0  Language- follow 3 step command 2 2 3   Language- read & follow direction 1 0 1  Write a sentence 1 0 1  Copy design 0 0 0  Total score 11 11 12      Generalized: Well developed, in no acute distress   Neurological examination  Mentation: Alert. Follows all commands speech and language fluent Cranial nerve II-XII: Pupils were equal round reactive to light. Extraocular movements were full, visual field were full on confrontational test. Facial sensation and strength were normal. Uvula tongue midline. Head turning and shoulder shrug  were normal and symmetric. Motor: The motor testing reveals 5 over 5 strength of all 4 extremities. Good symmetric motor tone is noted throughout.  Sensory: Sensory testing is intact to soft touch on all 4 extremities. No evidence of extinction is noted.  Coordination: Cerebellar testing reveals good finger-nose-finger and heel-to-shin bilaterally.  Gait and station: Gait is normal. Tandem  gait is normal. Romberg is negative. No drift is seen.  Reflexes: Deep tendon reflexes are symmetric and normal bilaterally.   DIAGNOSTIC DATA (LABS, IMAGING, TESTING) - I reviewed patient records, labs, notes, testing and imaging myself where available.  Lab Results  Component Value Date   WBC 5.7 01/30/2017   HGB 12.2 (L) 01/30/2017   HCT 33.5 (L) 01/30/2017   MCV 91.3 01/30/2017   PLT 141 (L) 01/30/2017      Component Value Date/Time   NA 140 01/30/2017 0439   NA 139 09/06/2014 0459   K 3.6 01/30/2017 0439   K 4.3 09/06/2014 0459   CL 110 01/30/2017 0439   CL 103 09/06/2014 0459   CO2 27 01/30/2017 0439   CO2 29 09/06/2014 0459   GLUCOSE 81 01/30/2017 0439   GLUCOSE 97 09/06/2014 0459   BUN 20 01/30/2017 0439   BUN 17 09/06/2014 0459   CREATININE 0.90 01/30/2017 0439   CREATININE 1.19 09/06/2014 0459   CALCIUM 8.2 (L) 01/30/2017 0439   CALCIUM 8.6 09/06/2014 0459   PROT 5.3 (L) 01/30/2017 0439   PROT 6.7 09/04/2014 0940   ALBUMIN 3.0 (L) 01/30/2017 0439   ALBUMIN 3.5 09/04/2014 0940   AST 17 01/30/2017 0439   AST 16 09/04/2014 0940   ALT 16 (L) 01/30/2017 0439   ALT 27 09/04/2014 0940   ALKPHOS 64 01/30/2017 0439   ALKPHOS 102 09/04/2014 0940   BILITOT 1.1 01/30/2017 0439   BILITOT 0.9 09/04/2014 0940   GFRNONAA >60 01/30/2017 0439   GFRNONAA 57 (L) 09/06/2014 0459   GFRAA >60 01/30/2017 0439   GFRAA >60 09/06/2014 0459   Lab Results  Component Value Date   CHOL 106 01/25/2016   HDL 26 (L) 01/25/2016   LDLCALC 50 01/25/2016   TRIG 148 01/25/2016   CHOLHDL 4.1 01/25/2016   Lab Results  Component Value Date   HGBA1C 5.2 01/25/2016    Lab  Results  Component Value Date   TSH 1.764 01/25/2016      ASSESSMENT AND PLAN 83 y.o. 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 here with:  1. Memory disturbance 2. Episodes of jerking 3. Anxiety  The patient's memory score has remained the same. I explained to the patient and the son that the episodes of jerking are not seizures. Also advised that it may be helpful for the patient to be evaluated by a psychiatrist potentially Dr. Donell Beers. If these episodes are from a psychiatric origin Dr. Donell Beers may be able to recommend medication or nonpharmacological treatment. The patient and his son is amenable to this. He will follow-up in 6 months or sooner if needed.     Butch Penny, MSN, NP-C 02/07/2017, 8:34 AM [facility] [address], [address] [place], [place] [address] [phone]

## 2017-02-18 DIAGNOSIS — I482 Chronic atrial fibrillation: Secondary | ICD-10-CM | POA: Diagnosis not present

## 2017-02-18 DIAGNOSIS — I495 Sick sinus syndrome: Secondary | ICD-10-CM | POA: Diagnosis not present

## 2017-02-18 DIAGNOSIS — I25708 Atherosclerosis of coronary artery bypass graft(s), unspecified, with other forms of angina pectoris: Secondary | ICD-10-CM | POA: Diagnosis not present

## 2017-02-18 DIAGNOSIS — I340 Nonrheumatic mitral (valve) insufficiency: Secondary | ICD-10-CM | POA: Diagnosis not present

## 2017-05-10 DIAGNOSIS — I442 Atrioventricular block, complete: Secondary | ICD-10-CM | POA: Diagnosis not present

## 2017-05-31 DIAGNOSIS — Z1329 Encounter for screening for other suspected endocrine disorder: Secondary | ICD-10-CM | POA: Diagnosis not present

## 2017-05-31 DIAGNOSIS — Z0000 Encounter for general adult medical examination without abnormal findings: Secondary | ICD-10-CM | POA: Diagnosis not present

## 2017-05-31 DIAGNOSIS — E538 Deficiency of other specified B group vitamins: Secondary | ICD-10-CM | POA: Diagnosis not present

## 2017-05-31 DIAGNOSIS — Z125 Encounter for screening for malignant neoplasm of prostate: Secondary | ICD-10-CM | POA: Diagnosis not present

## 2017-05-31 DIAGNOSIS — F0150 Vascular dementia without behavioral disturbance: Secondary | ICD-10-CM | POA: Diagnosis not present

## 2017-05-31 DIAGNOSIS — I10 Essential (primary) hypertension: Secondary | ICD-10-CM | POA: Diagnosis not present

## 2017-05-31 DIAGNOSIS — Z79899 Other long term (current) drug therapy: Secondary | ICD-10-CM | POA: Diagnosis not present

## 2017-05-31 DIAGNOSIS — E782 Mixed hyperlipidemia: Secondary | ICD-10-CM | POA: Diagnosis not present

## 2017-05-31 DIAGNOSIS — I482 Chronic atrial fibrillation: Secondary | ICD-10-CM | POA: Diagnosis not present

## 2017-06-23 DIAGNOSIS — B351 Tinea unguium: Secondary | ICD-10-CM | POA: Diagnosis not present

## 2017-06-23 DIAGNOSIS — M2580 Other specified joint disorders, unspecified joint: Secondary | ICD-10-CM | POA: Diagnosis not present

## 2017-06-23 DIAGNOSIS — M7752 Other enthesopathy of left foot: Secondary | ICD-10-CM | POA: Diagnosis not present

## 2017-08-05 ENCOUNTER — Emergency Department
Admission: EM | Admit: 2017-08-05 | Discharge: 2017-08-05 | Disposition: A | Discharge status: Home / Self Care | Payer: Medicare HMO | Attending: Emergency Medicine | Admitting: Emergency Medicine

## 2017-08-05 ENCOUNTER — Emergency Department: Payer: Medicare HMO

## 2017-08-05 DIAGNOSIS — Z7901 Long term (current) use of anticoagulants: Secondary | ICD-10-CM | POA: Diagnosis not present

## 2017-08-05 DIAGNOSIS — E876 Hypokalemia: Secondary | ICD-10-CM | POA: Diagnosis not present

## 2017-08-05 DIAGNOSIS — F0280 Dementia in other diseases classified elsewhere without behavioral disturbance: Secondary | ICD-10-CM | POA: Diagnosis not present

## 2017-08-05 DIAGNOSIS — R4182 Altered mental status, unspecified: Secondary | ICD-10-CM | POA: Diagnosis not present

## 2017-08-05 DIAGNOSIS — I255 Ischemic cardiomyopathy: Secondary | ICD-10-CM | POA: Insufficient documentation

## 2017-08-05 DIAGNOSIS — Z79899 Other long term (current) drug therapy: Secondary | ICD-10-CM | POA: Diagnosis not present

## 2017-08-05 DIAGNOSIS — Z87891 Personal history of nicotine dependence: Secondary | ICD-10-CM | POA: Insufficient documentation

## 2017-08-05 DIAGNOSIS — G309 Alzheimer's disease, unspecified: Secondary | ICD-10-CM | POA: Insufficient documentation

## 2017-08-05 DIAGNOSIS — I25710 Atherosclerosis of autologous vein coronary artery bypass graft(s) with unstable angina pectoris: Secondary | ICD-10-CM | POA: Diagnosis not present

## 2017-08-05 DIAGNOSIS — Z95810 Presence of automatic (implantable) cardiac defibrillator: Secondary | ICD-10-CM | POA: Diagnosis not present

## 2017-08-05 DIAGNOSIS — I700 Atherosclerosis of aorta: Secondary | ICD-10-CM | POA: Diagnosis not present

## 2017-08-05 DIAGNOSIS — R4020 Unspecified coma: Secondary | ICD-10-CM | POA: Diagnosis not present

## 2017-08-05 DIAGNOSIS — Z7982 Long term (current) use of aspirin: Secondary | ICD-10-CM | POA: Diagnosis not present

## 2017-08-05 LAB — URINALYSIS, COMPLETE (UACMP) WITH MICROSCOPIC
BACTERIA UA: NONE SEEN
Bilirubin Urine: NEGATIVE
Glucose, UA: NEGATIVE mg/dL
Ketones, ur: 5 mg/dL — AB
Leukocytes, UA: NEGATIVE
NITRITE: NEGATIVE
PROTEIN: NEGATIVE mg/dL
Specific Gravity, Urine: 1.018 (ref 1.005–1.030)
Squamous Epithelial / LPF: NONE SEEN
pH: 5 (ref 5.0–8.0)

## 2017-08-05 LAB — COMPREHENSIVE METABOLIC PANEL
ALT: 16 U/L — ABNORMAL LOW (ref 17–63)
ANION GAP: 8 (ref 5–15)
AST: 20 U/L (ref 15–41)
Albumin: 3.9 g/dL (ref 3.5–5.0)
Alkaline Phosphatase: 97 U/L (ref 38–126)
BILIRUBIN TOTAL: 1.6 mg/dL — AB (ref 0.3–1.2)
BUN: 22 mg/dL — AB (ref 6–20)
CALCIUM: 9.3 mg/dL (ref 8.9–10.3)
CO2: 27 mmol/L (ref 22–32)
Chloride: 107 mmol/L (ref 101–111)
Creatinine, Ser: 0.97 mg/dL (ref 0.61–1.24)
GFR calc Af Amer: >60 mL/min (ref >60)
Glucose, Bld: 97 mg/dL (ref 65–99)
POTASSIUM: 3 mmol/L — AB (ref 3.5–5.1)
Sodium: 142 mmol/L (ref 135–145)
TOTAL PROTEIN: 6.9 g/dL (ref 6.5–8.1)

## 2017-08-05 LAB — TROPONIN I
TROPONIN I: 0.03 ng/mL — AB (ref <0.03)
Troponin I: 0.04 ng/mL (ref <0.03)

## 2017-08-05 LAB — CBC
HEMATOCRIT: 41.9 % (ref 40.0–52.0)
HEMOGLOBIN: 14.5 g/dL (ref 13.0–18.0)
MCH: 32 pg (ref 26.0–34.0)
MCHC: 34.7 g/dL (ref 32.0–36.0)
MCV: 92.3 fL (ref 80.0–100.0)
Platelets: 147 10*3/uL — ABNORMAL LOW (ref 150–440)
RBC: 4.54 MIL/uL (ref 4.40–5.90)
RDW: 14.1 % (ref 11.5–14.5)
WBC: 6.6 10*3/uL (ref 3.8–10.6)

## 2017-08-05 LAB — TSH: TSH: 1.154 u[IU]/mL (ref 0.350–4.500)

## 2017-08-05 LAB — AMMONIA: Ammonia: <9 umol/L — ABNORMAL LOW (ref 9–35)

## 2017-08-05 LAB — SALICYLATE LEVEL

## 2017-08-05 LAB — PROTIME-INR
INR: 1.05
PROTHROMBIN TIME: 13.7 s (ref 11.4–15.2)

## 2017-08-05 LAB — GLUCOSE, CAPILLARY: GLUCOSE-CAPILLARY: 86 mg/dL (ref 65–99)

## 2017-08-05 LAB — APTT: aPTT: 33 seconds (ref 24–36)

## 2017-08-05 LAB — ACETAMINOPHEN LEVEL: Acetaminophen (Tylenol), Serum: <10 ug/mL — ABNORMAL LOW (ref 10–30)

## 2017-08-05 MED ORDER — POTASSIUM CHLORIDE CRYS ER 20 MEQ PO TBCR
EXTENDED_RELEASE_TABLET | ORAL | Status: AC
  Administered 2017-08-05: 40 meq via ORAL
  Filled 2017-08-05: qty 2

## 2017-08-05 MED ORDER — POTASSIUM CHLORIDE CRYS ER 20 MEQ PO TBCR
40.0000 meq | EXTENDED_RELEASE_TABLET | Freq: Once | ORAL | Status: AC
  Administered 2017-08-05: 40 meq via ORAL

## 2017-08-05 NOTE — ED Notes (Signed)
Patient reports he does not remember telling his son last night that he needed to go to the emergency room.  Pt has not complaints at this time.
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
## 2017-08-05 NOTE — ED Notes (Signed)
Patient awake and alert when this RN entered the room.  This RN informed patient urine sample was needed, gave patient urinal, and asked him to let nurse/son know when he was able to provide a specimen.  Pt stated: "hell I can do that now", asked for rail to be put down, stood with steady stance, and provided specimen.  MD informed.

## 2017-08-05 NOTE — ED Notes (Signed)
Patient transported to CT 
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
## 2017-08-05 NOTE — ED Triage Notes (Signed)
Patient hx of dementia.  Brought from home to another family member's home for care today.  Pt stopped responding to family at approx 08:30 this am.  Patient stopped responding to family at 2300.  Pt told son last night he was dying, and that he needed to call 911.

## 2017-08-09 ENCOUNTER — Telehealth: Payer: Self-pay | Admitting: Neurology

## 2017-08-09 ENCOUNTER — Ambulatory Visit (INDEPENDENT_AMBULATORY_CARE_PROVIDER_SITE_OTHER): Payer: Medicare HMO | Admitting: Neurology

## 2017-08-09 ENCOUNTER — Encounter: Payer: Self-pay | Admitting: Neurology

## 2017-08-09 VITALS — BP 130/73 | HR 88 | Ht 72.0 in | Wt 138.0 lb

## 2017-08-09 DIAGNOSIS — F0280 Dementia in other diseases classified elsewhere without behavioral disturbance: Secondary | ICD-10-CM

## 2017-08-09 DIAGNOSIS — G300 Alzheimer's disease with early onset: Secondary | ICD-10-CM

## 2017-08-09 MED ORDER — NAMENDA XR 28 MG PO CP24: 28.0000 mg | ORAL_CAPSULE | Freq: Every day | ORAL | 3 refills | Status: AC

## 2017-08-09 MED ORDER — DONEPEZIL HCL 5 MG PO TABS: 5.0000 mg | ORAL_TABLET | Freq: Every day | ORAL | 3 refills | Status: AC

## 2017-08-09 MED ORDER — OLANZAPINE 5 MG PO TABS: 5.0000 mg | ORAL_TABLET | Freq: Two times a day (BID) | ORAL | 3 refills | Status: DC

## 2017-08-09 MED ORDER — RISPERIDONE 0.5 MG PO TABS: 0.5000 mg | ORAL_TABLET | Freq: Two times a day (BID) | ORAL | 3 refills | Status: AC

## 2017-08-09 NOTE — Telephone Encounter (Signed)
Pt son (which states he is the medical Power Of Attorney) calling due to the medication OLANZapine (ZYPREXA) 5 MG tablet called in by Willis today. According to the pharmacy the medicaiton comes with a concerning warning label about giving to the elderly with Dementia and possibly causing death.  Pt son is asking to be called about this medication.  Pt son wants to know if there is an alternative to this medication.He states the pharmacy  [facility] - [place], [place] - [address] [phone] (Phone) [phone] (Fax)   Faxed over information about the warning.  Please call pt son.

## 2017-08-09 NOTE — Addendum Note (Signed)
Addended by: York SpanielWILLIS, CHARLES K on: 08/09/2017 02:13 PM   Modules accepted: Orders

## 2017-08-09 NOTE — Patient Instructions (Signed)
   We will start Zyprexa 5 mg, begin one at night for at least 1 week, if needed, increase to one twice a day. Look out for drowsiness.
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
## 2017-08-09 NOTE — Telephone Encounter (Signed)
I called the patient. The American Academy of neurology recommends Zyprexa as a first-line antipsychotic medication for dementia associated with agitation.  There is a black box warning with use an elderly, the pharmacist indicated that Risperdal does not have this morning, the son wishes to use this medication.  I will switch the patient to Risperdal.

## 2017-08-18 ENCOUNTER — Encounter: Payer: Self-pay | Admitting: Emergency Medicine

## 2017-08-18 ENCOUNTER — Emergency Department
Admission: EM | Admit: 2017-08-18 | Discharge: 2017-08-21 | Disposition: A | Discharge status: Other Acute Inpatient Hospital | Payer: Medicare HMO | Attending: Student in an Organized Health Care Education/Training Program | Admitting: Student in an Organized Health Care Education/Training Program

## 2017-08-18 DIAGNOSIS — F79 Unspecified intellectual disabilities: Secondary | ICD-10-CM | POA: Diagnosis not present

## 2017-08-18 DIAGNOSIS — R456 Violent behavior: Secondary | ICD-10-CM | POA: Diagnosis not present

## 2017-08-18 DIAGNOSIS — R4020 Unspecified coma: Secondary | ICD-10-CM | POA: Diagnosis not present

## 2017-08-18 DIAGNOSIS — F0281 Dementia in other diseases classified elsewhere with behavioral disturbance: Secondary | ICD-10-CM | POA: Diagnosis not present

## 2017-08-18 DIAGNOSIS — G309 Alzheimer's disease, unspecified: Secondary | ICD-10-CM | POA: Insufficient documentation

## 2017-08-18 DIAGNOSIS — I2510 Atherosclerotic heart disease of native coronary artery without angina pectoris: Secondary | ICD-10-CM | POA: Insufficient documentation

## 2017-08-18 DIAGNOSIS — R451 Restlessness and agitation: Secondary | ICD-10-CM | POA: Diagnosis not present

## 2017-08-18 DIAGNOSIS — Z01818 Encounter for other preprocedural examination: Secondary | ICD-10-CM | POA: Diagnosis not present

## 2017-08-18 DIAGNOSIS — Z79899 Other long term (current) drug therapy: Secondary | ICD-10-CM | POA: Insufficient documentation

## 2017-08-18 DIAGNOSIS — F0391 Unspecified dementia with behavioral disturbance: Secondary | ICD-10-CM

## 2017-08-18 DIAGNOSIS — Z7901 Long term (current) use of anticoagulants: Secondary | ICD-10-CM | POA: Diagnosis not present

## 2017-08-18 DIAGNOSIS — F03918 Unspecified dementia, unspecified severity, with other behavioral disturbance: Secondary | ICD-10-CM

## 2017-08-18 DIAGNOSIS — G301 Alzheimer's disease with late onset: Secondary | ICD-10-CM | POA: Diagnosis not present

## 2017-08-18 DIAGNOSIS — I10 Essential (primary) hypertension: Secondary | ICD-10-CM | POA: Insufficient documentation

## 2017-08-18 DIAGNOSIS — Z7982 Long term (current) use of aspirin: Secondary | ICD-10-CM | POA: Insufficient documentation

## 2017-08-18 DIAGNOSIS — Z87891 Personal history of nicotine dependence: Secondary | ICD-10-CM | POA: Insufficient documentation

## 2017-08-18 DIAGNOSIS — F99 Mental disorder, not otherwise specified: Secondary | ICD-10-CM | POA: Diagnosis not present

## 2017-08-18 DIAGNOSIS — R4182 Altered mental status, unspecified: Secondary | ICD-10-CM | POA: Diagnosis not present

## 2017-08-18 DIAGNOSIS — M47815 Spondylosis without myelopathy or radiculopathy, thoracolumbar region: Secondary | ICD-10-CM | POA: Diagnosis not present

## 2017-08-18 LAB — CBC WITH DIFFERENTIAL/PLATELET
Basophils Absolute: 0.1 10*3/uL (ref 0–0.1)
Basophils Relative: 1 %
EOS ABS: 0.3 10*3/uL (ref 0–0.7)
EOS PCT: 4 %
HCT: 40 % (ref 40.0–52.0)
Hemoglobin: 13.8 g/dL (ref 13.0–18.0)
LYMPHS ABS: 2.2 10*3/uL (ref 1.0–3.6)
Lymphocytes Relative: 28 %
MCH: 31.6 pg (ref 26.0–34.0)
MCHC: 34.4 g/dL (ref 32.0–36.0)
MCV: 91.7 fL (ref 80.0–100.0)
MONOS PCT: 14 %
Monocytes Absolute: 1.1 10*3/uL — ABNORMAL HIGH (ref 0.2–1.0)
Neutro Abs: 4.2 10*3/uL (ref 1.4–6.5)
Neutrophils Relative %: 53 %
PLATELETS: 156 10*3/uL (ref 150–440)
RBC: 4.36 MIL/uL — ABNORMAL LOW (ref 4.40–5.90)
RDW: 14.2 % (ref 11.5–14.5)
WBC: 7.8 10*3/uL (ref 3.8–10.6)

## 2017-08-18 LAB — TROPONIN I

## 2017-08-18 LAB — COMPREHENSIVE METABOLIC PANEL
ALT: 16 U/L — AB (ref 17–63)
AST: 20 U/L (ref 15–41)
Albumin: 3.6 g/dL (ref 3.5–5.0)
Alkaline Phosphatase: 93 U/L (ref 38–126)
Anion gap: 8 (ref 5–15)
BILIRUBIN TOTAL: 0.9 mg/dL (ref 0.3–1.2)
BUN: 21 mg/dL — AB (ref 6–20)
CHLORIDE: 107 mmol/L (ref 101–111)
CO2: 28 mmol/L (ref 22–32)
CREATININE: 1.05 mg/dL (ref 0.61–1.24)
Calcium: 9.2 mg/dL (ref 8.9–10.3)
GFR calc Af Amer: >60 mL/min (ref >60)
Glucose, Bld: 93 mg/dL (ref 65–99)
Potassium: 3.3 mmol/L — ABNORMAL LOW (ref 3.5–5.1)
Sodium: 143 mmol/L (ref 135–145)
Total Protein: 6.4 g/dL — ABNORMAL LOW (ref 6.5–8.1)

## 2017-08-18 MED ORDER — RISPERIDONE 1 MG PO TABS
0.5000 mg | ORAL_TABLET | Freq: Every day | ORAL | Status: DC
  Administered 2017-08-18: 0.5 mg via ORAL
  Filled 2017-08-18: qty 1

## 2017-08-18 NOTE — ED Notes (Signed)
Pt son arrived at nursing station very demanding and requesting to see the doctor - he refused to go to the pt room stating that he did not want his father to see him - I tried to get him a chair for outside the room and he stated that he would stand at the nurses station until someone talked to him - escorted him to subwait - Dr Roxan Hockey notified and he stated he would speak to the son after seeing the pt - pt son made aware - pt son then returned to the nurses station and said he wanted to inform me of one more thing - he did not want any test done on his father that he was here 2 weeks ago and nothing was found and that he refused all testing - Dr Roxan Hockey made aware
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
## 2017-08-19 ENCOUNTER — Emergency Department: Payer: Medicare HMO

## 2017-08-19 DIAGNOSIS — F0281 Dementia in other diseases classified elsewhere with behavioral disturbance: Secondary | ICD-10-CM

## 2017-08-19 DIAGNOSIS — G301 Alzheimer's disease with late onset: Secondary | ICD-10-CM

## 2017-08-19 DIAGNOSIS — Z01818 Encounter for other preprocedural examination: Secondary | ICD-10-CM | POA: Diagnosis not present

## 2017-08-19 DIAGNOSIS — M47815 Spondylosis without myelopathy or radiculopathy, thoracolumbar region: Secondary | ICD-10-CM | POA: Diagnosis not present

## 2017-08-19 DIAGNOSIS — F0391 Unspecified dementia with behavioral disturbance: Secondary | ICD-10-CM

## 2017-08-19 DIAGNOSIS — F03918 Unspecified dementia, unspecified severity, with other behavioral disturbance: Secondary | ICD-10-CM

## 2017-08-19 LAB — URINALYSIS, COMPLETE (UACMP) WITH MICROSCOPIC
Bacteria, UA: NONE SEEN
Bilirubin Urine: NEGATIVE
GLUCOSE, UA: NEGATIVE mg/dL
Ketones, ur: NEGATIVE mg/dL
Leukocytes, UA: NEGATIVE
NITRITE: NEGATIVE
PROTEIN: NEGATIVE mg/dL
Specific Gravity, Urine: 1.015 (ref 1.005–1.030)
pH: 5 (ref 5.0–8.0)

## 2017-08-19 LAB — URINE DRUG SCREEN, QUALITATIVE (ARMC ONLY)
AMPHETAMINES, UR SCREEN: NOT DETECTED
Barbiturates, Ur Screen: NOT DETECTED
Benzodiazepine, Ur Scrn: NOT DETECTED
Cannabinoid 50 Ng, Ur ~~LOC~~: NOT DETECTED
Cocaine Metabolite,Ur ~~LOC~~: NOT DETECTED
MDMA (ECSTASY) UR SCREEN: NOT DETECTED
Methadone Scn, Ur: NOT DETECTED
Opiate, Ur Screen: NOT DETECTED
PHENCYCLIDINE (PCP) UR S: NOT DETECTED
TRICYCLIC, UR SCREEN: NOT DETECTED

## 2017-08-19 MED ORDER — DONEPEZIL HCL 5 MG PO TABS
5.0000 mg | ORAL_TABLET | Freq: Every day | ORAL | Status: DC
  Administered 2017-08-19 – 2017-08-20 (×2): 5 mg via ORAL
  Filled 2017-08-19 (×2): qty 1

## 2017-08-19 MED ORDER — RISPERIDONE 0.5 MG PO TBDP
0.2500 mg | ORAL_TABLET | Freq: Every day | ORAL | Status: DC
Start: 2017-08-19 — End: 2017-08-21
  Administered 2017-08-19 – 2017-08-21 (×3): 0.25 mg via ORAL
  Filled 2017-08-19 (×3): qty 1

## 2017-08-19 MED ORDER — GABAPENTIN 300 MG PO CAPS
300.0000 mg | ORAL_CAPSULE | Freq: Every morning | ORAL | Status: DC
  Administered 2017-08-19 – 2017-08-21 (×3): 300 mg via ORAL
  Filled 2017-08-19 (×3): qty 1

## 2017-08-19 MED ORDER — FINASTERIDE 5 MG PO TABS
5.0000 mg | ORAL_TABLET | Freq: Every day | ORAL | Status: DC
  Administered 2017-08-19 – 2017-08-21 (×3): 5 mg via ORAL
  Filled 2017-08-19 (×3): qty 1

## 2017-08-19 MED ORDER — AMLODIPINE BESYLATE 5 MG PO TABS
5.0000 mg | ORAL_TABLET | Freq: Every day | ORAL | Status: DC
  Administered 2017-08-19 – 2017-08-21 (×3): 5 mg via ORAL
  Filled 2017-08-19 (×3): qty 1

## 2017-08-19 MED ORDER — METOPROLOL SUCCINATE ER 50 MG PO TB24
100.0000 mg | ORAL_TABLET | Freq: Every day | ORAL | Status: DC
  Administered 2017-08-19 – 2017-08-21 (×3): 100 mg via ORAL
  Filled 2017-08-19 (×3): qty 2

## 2017-08-19 MED ORDER — PANTOPRAZOLE SODIUM 40 MG PO TBEC
40.0000 mg | DELAYED_RELEASE_TABLET | Freq: Every day | ORAL | Status: DC
  Administered 2017-08-20 – 2017-08-21 (×2): 40 mg via ORAL
  Filled 2017-08-19 (×3): qty 1

## 2017-08-19 MED ORDER — ISOSORBIDE MONONITRATE ER 60 MG PO TB24
60.0000 mg | ORAL_TABLET | Freq: Every day | ORAL | Status: DC
  Administered 2017-08-19 – 2017-08-21 (×3): 60 mg via ORAL
  Filled 2017-08-19 (×4): qty 1

## 2017-08-19 MED ORDER — APIXABAN 5 MG PO TABS
5.0000 mg | ORAL_TABLET | Freq: Every day | ORAL | Status: DC
  Administered 2017-08-19 – 2017-08-21 (×3): 5 mg via ORAL
  Filled 2017-08-19 (×3): qty 1

## 2017-08-19 MED ORDER — MEMANTINE HCL ER 28 MG PO CP24
28.0000 mg | ORAL_CAPSULE | Freq: Every day | ORAL | Status: DC
  Administered 2017-08-19 – 2017-08-21 (×3): 28 mg via ORAL
  Filled 2017-08-19 (×3): qty 1

## 2017-08-19 MED ORDER — TUBERCULIN PPD 5 UNIT/0.1ML ID SOLN
5.0000 [IU] | Freq: Once | INTRADERMAL | Status: AC
  Administered 2017-08-19: 5 [IU] via INTRADERMAL
  Filled 2017-08-19: qty 0.1

## 2017-08-19 MED ORDER — RISPERIDONE 0.5 MG PO TBDP
0.5000 mg | ORAL_TABLET | Freq: Every day | ORAL | Status: DC
  Administered 2017-08-19 – 2017-08-20 (×2): 0.5 mg via ORAL
  Filled 2017-08-19 (×2): qty 1

## 2017-08-19 MED ORDER — ATORVASTATIN CALCIUM 20 MG PO TABS
40.0000 mg | ORAL_TABLET | Freq: Every day | ORAL | Status: DC
  Administered 2017-08-20 – 2017-08-21 (×2): 40 mg via ORAL
  Filled 2017-08-19 (×3): qty 2

## 2017-08-19 NOTE — ED Notes (Signed)
Pt assisted to the bathroom.

## 2017-08-19 NOTE — Progress Notes (Signed)
LCSW consulted with patients son and Darel Hong from [facility] in [place] requested patient information and may have a memory care semi private bed. Patients son was encouraged to speak with her. It was discussed that someone will come out to assess patient in the next couple of days and will attempt to find placement in one of their locations. Patient son is in Insurance risk surveyor.  Requested a TB test ordered and Fl2 to be signed.  [employer] LCSW [phone]

## 2017-08-19 NOTE — Progress Notes (Signed)
Referral information for Psychiatric Hospitalization faxed to;   TTS to fax to the following geri Psych facilities and LCSW will follow up    [facility] [phone]), TTS will send fax   [facility] [phone] or [phone] No dementia patients accepted [address] Franky Macho [phone]), TTS will fax over   [facility] [phone]), Spoke to [facility] No beds today TTS will fax over, LCSW will follow over weekend   [facility] [phone]), Spoke to [facility] No beds TTS will fax over pt info and LCSW will follow up over weekend   [facility] [phone]), TTS to fax over   [facility] [phone])Spoke to [facility] no dementia patients accepted DECLINED   [facility] [phone]), Spoke to [facility] no dementia patients accepted [facility] [phone] or [phone]), TTS will fax over patient info LCSW will follow up over weekend   [facility] [phone]), Spoke to [facility] they do not accept dementia patients DECLINED  [employer] LCSW [phone]

## 2017-08-19 NOTE — ED Notes (Signed)
Pt standing in doorway, talking with staff about wanting food, but not liking our options, driving his car, hating his sister. Calm and cooperative, just talking. Eats minimal parts of sandwich tray, ate 2 vanilla ice cream happily. Pt moved back to bed. Ambulatory with no issues. Staff keeping pt within arms reach. Will continue to monitor.

## 2017-08-19 NOTE — ED Notes (Signed)
Referral information for Psychiatric Hospitalization faxed to;     [facility] [phone]), No beds today   [facility] [phone]), No beds today   [facility] [phone]), Referral sent    [facility] [phone]) denied due to dementia    [facility] [phone]), denied due to dementia    [facility] [phone] or [phone]), Referral sent

## 2017-08-19 NOTE — ED Provider Notes (Signed)
Clinical Course as of Aug 20 226  Nathan Saunders Aug 19, 2017  1706 the patient is being evaluated for placement. I was told by social work and that he needs a chest x-ray and PPD.  I ordered both.  He will need a physician to interpret and document the PPD results on the afternoon/evening of 08/21/2017.  [CF]  Sat Aug 20, 2017  0007 No evidence of active TB. DG Chest 2 View [CF]    Clinical Course User Index [CF] Loleta Rose, MD      Loleta Rose, MD 08/20/17 [phone]

## 2017-08-19 NOTE — ED Notes (Signed)
Pt initially refused to take morning meds. Talked pt into taking BP meds. Will try the rest at lunch. meds placed in patient specific drawer in pixis in labeled bag.

## 2017-08-19 NOTE — ED Notes (Addendum)
TB test given 08/19/17 1746 left forearm, circled in black - needs to be read 08/21/17 after 1746 and before 08/22/17 1730

## 2017-08-19 NOTE — ED Notes (Signed)
Pt remains sleeping in hospital bed with sitter and law enforcement present. Pt in NAD with equal, unlabored respirations. Will continue to monitor.

## 2017-08-19 NOTE — Progress Notes (Signed)
LCSW met with patients son and discussed Private pay locked down facilities. LCSW was able to complete Fl2 and did contact these facilities on behalf of the patient completed collecting information on patient and payment options  [facility]- no beds for 3 months [facility]- Patients son to visit today.  LCSW will place information via the HUB at patients son request for Memory Care unit  [facility] LCSW [phone]

## 2017-08-19 NOTE — Clinical Social Work Note (Signed)
Clinical Social Work Assessment  Patient Details  Name: Nathan Saunders MRN: [id number] Date of Birth: Dec 30, 1931  Date of referral:  08/19/17               Reason for consult:  Family Concerns, Discharge Planning                Permission sought to share information with:  Family Supports, Magazine features editor Permission granted to share information::  Yes, Verbal Permission Granted  Name::     Nathan Saunders [phone] POA and HCPOA ( son)  Agency::  All facilities  Relationship::     Contact Information:     Housing/Transportation Living arrangements for the past 2 months:  Single Family Home Source of Information:  Patient, Adult Children Patient Interpreter Needed:  None Criminal Activity/Legal Involvement Pertinent to Current Situation/Hospitalization:  No - Comment as needed Significant Relationships:  Adult Children Lives with:  Adult Children Do you feel safe going back to the place where you live?  Yes Need for family participation in patient care:  Yes (Comment)  Care giving concerns: Son reports he is unable to continue to care for is father   Office manager / plan:  LCSW introduced myself to patient and he gave me verbal consent to speak to his son Nathan Saunders [phone]. LCSW spoke to ED RN patient is verbal and polite and calm. He is oriented to person and place but not situation. X2, Patient is residing with his son in his house and stated they do get along. He is able to dress himself and and in not incontinent. He receives and has his own money. In brief phone discussion with son he has been working with Dr Nathan Saunders to have his father placed in skilled nursing facility ( memory care) as his father has dementia and can be verbally aggressive. This patient has been polite and calm in hospital with no aggressive behavior. Patient is to be seen by psychiatrist and evaluated. LCSW will continue with Fl2 and Passr Number and provide family with information  on facilities that have beds and or in home health supports.  Employment status:  Retired Health and safety inspector:  Agricultural consultant) PT Recommendations:  Not assessed at this time Information / Referral to community resources:  Skilled Nursing Facility  Patient/Family's Response to care: patient is not impressed with hospital food  Patient/Family's Understanding of and Emotional Response to Diagnosis, Current Treatment, and Prognosis:  Patients son would like some assistance with finding a suitable placement for his father  Emotional Assessment Appearance:  Appears stated age Attitude/Demeanor/Rapport:   (Polite and calm ) Affect (typically observed):  Calm, Accepting, Appropriate Orientation:  Oriented to Self, Oriented to Place Alcohol / Substance use:  Not Applicable Psych involvement (Current and /or in the community):  To be assessed  Discharge Needs  Concerns to be addressed:  Discharge Planning Concerns, Home Safety Concerns Readmission within the last 30 days:  Yes Current discharge risk:  Cognitively Impaired (Dementia) Barriers to Discharge:  Family Issues, Unsafe home situation   Nathan Schaumann, LCSW 08/19/2017, 8:38 AM

## 2017-08-19 NOTE — ED Notes (Signed)
Pt given lunchbox.

## 2017-08-19 NOTE — Progress Notes (Signed)
LCSW reviewed patient notes and will consult with ED staff to see if assessment is appropriate and contact family.  [employer] LCSW [phone]

## 2017-08-19 NOTE — Progress Notes (Signed)
LCSW supported interview with Misty Stanley from [place] to assess patient for future placement in a memory care facility.  Meeting was concluded and LCSW consulted with ED RN patient is doing well no issues.  [employer] LCSW [phone]
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
## 2017-08-19 NOTE — ED Notes (Signed)
Pt had BM in pants and urinated against wall, pt cleaned and put into diaper and new scrubs, EVS called and Tim cleaned room
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
## 2017-08-19 NOTE — ED Notes (Signed)
Pt sleeping in bed at this time in NAD with equal and unlabored respirations. Will continue to monitor. Sitter remains present.

## 2017-08-19 NOTE — NC FL2 (Signed)
[place] MEDICAID FL2 LEVEL OF CARE SCREENING TOOL     IDENTIFICATION  Patient Name: Nathan Saunders Birthdate: 03-18-1932 Sex: male Admission Date (Current Location): 08/18/2017  [place] and [place] Number:  Chiropodist and Address:  [facility], [address] [place], [place] [address]      Provider Number: [id number]  Attending Physician Name and Address:  No att. providers found  Relative Name and Phone Number:       Current Level of Care: Hospital Recommended Level of Care: Memory Care Prior Approval Number:    Date Approved/Denied:   PASRR Number:    Discharge Plan: SNF    Current Diagnoses: Patient Active Problem List   Diagnosis Date Noted  . Anemia 01/30/2017  . Thrombocytopenia (HCC) 01/30/2017  . Chest pain 01/30/2017  . Altered mental status 01/29/2017  . Tremor 01/29/2017  . Transient alteration of awareness 12/07/2016  . Unstable angina (HCC) 01/25/2016  . Hypertension 01/25/2016  . MR (mental retardation), moderate 01/25/2016  . Hypertensive urgency 01/25/2016  . Coronary artery disease involving native heart with unstable angina pectoris (HCC) 01/25/2016  . Dyslipidemia 01/25/2016  . Alzheimer's disease 04/18/2015  . Memory difficulty 09/24/2014    Orientation RESPIRATION BLADDER Height & Weight     Self, Place  Normal Continent Weight: 144 lb 10 oz (65.6 kg) Height:  6' (182.9 cm)  BEHAVIORAL SYMPTOMS/MOOD NEUROLOGICAL BOWEL NUTRITION STATUS      Continent Diet (Normal)  AMBULATORY STATUS COMMUNICATION OF NEEDS Skin   Independent Verbally Normal                       Personal Care Assistance Level of Assistance  Bathing, Feeding, Dressing, Total care Bathing Assistance: Limited assistance Feeding assistance: Independent Dressing Assistance: Limited assistance Total Care Assistance: Limited assistance   Functional Limitations Info  Sight, Hearing, Speech Sight Info: Adequate Hearing Info:  Adequate Speech Info: Adequate    SPECIAL CARE FACTORS FREQUENCY                       Contractures Contractures Info: Not present    Additional Factors Info  Allergies   Allergies Info: Seroquil and SSRI inhibitors           Current Medications (08/19/2017):  This is the current hospital active medication list Current Facility-Administered Medications  Medication Dose Route Frequency Provider Last Rate Last Dose  . amLODipine (NORVASC) tablet 5 mg  5 mg Oral Daily McShane, Rudy Jew, MD      . apixaban (ELIQUIS) tablet 5 mg  5 mg Oral Daily Jeanmarie Plant, MD      . atorvastatin (LIPITOR) tablet 40 mg  40 mg Oral Daily Jeanmarie Plant, MD      . donepezil (ARICEPT) tablet 5 mg  5 mg Oral QHS Jeanmarie Plant, MD      . finasteride (PROSCAR) tablet 5 mg  5 mg Oral Daily Jeanmarie Plant, MD      . gabapentin (NEURONTIN) capsule 300 mg  300 mg Oral q morning - 10a McShane, Rudy Jew, MD      . isosorbide mononitrate (IMDUR) 24 hr tablet 60 mg  60 mg Oral Daily Jeanmarie Plant, MD      . memantine (NAMENDA XR) 24 hr capsule 28 mg  28 mg Oral Daily Jeanmarie Plant, MD      . metoprolol succinate (TOPROL-XL) 24 hr tablet 100 mg  100  mg Oral Daily Jeanmarie Plant, MD      . pantoprazole (PROTONIX) EC tablet 40 mg  40 mg Oral Daily Jeanmarie Plant, MD      . risperiDONE (RISPERDAL) tablet 0.5 mg  0.5 mg Oral QHS Willy Eddy, MD   0.5 mg at 08/18/17 2110   Current Outpatient Prescriptions  Medication Sig Dispense Refill  . amLODipine (NORVASC) 5 MG tablet Take 1 tablet (5 mg total) by mouth daily. 30 tablet 2  . apixaban (ELIQUIS) 5 MG TABS tablet Take 5 mg by mouth daily.    Marland Kitchen aspirin EC 81 MG EC tablet Take 1 tablet (81 mg total) by mouth daily. 30 tablet 6  . atorvastatin (LIPITOR) 40 MG tablet Take 40 mg by mouth daily.    Marland Kitchen donepezil (ARICEPT) 5 MG tablet Take 1 tablet (5 mg total) by mouth at bedtime. 90 tablet 3  . finasteride (PROSCAR) 5 MG tablet Take 5 mg by  mouth daily.    . isosorbide mononitrate (IMDUR) 60 MG 24 hr tablet Take 60 mg by mouth daily.    . metoprolol succinate (TOPROL-XL) 100 MG 24 hr tablet Take 100 mg by mouth daily.    Marland Kitchen NAMENDA XR 28 MG CP24 24 hr capsule Take 1 capsule (28 mg total) by mouth daily. 90 capsule 3  . nitroGLYCERIN (NITRODUR - DOSED IN MG/24 HR) 0.1 mg/hr patch Place 0.1 mg onto the skin daily as needed (for angina).     . pantoprazole (PROTONIX) 40 MG tablet Take 40 mg by mouth daily.    . risperiDONE (RISPERDAL) 0.5 MG tablet Take 1 tablet (0.5 mg total) by mouth 2 (two) times daily. 60 tablet 3  . gabapentin (NEURONTIN) 300 MG capsule Take 300 mg by mouth every morning.       Discharge Medications: Please see discharge summary for a list of discharge medications.  Relevant Imaging Results:  Relevant Lab Results:   Additional Information    Johnella Moloney, [place], [place]

## 2017-08-19 NOTE — Progress Notes (Signed)
LCSW supported patients son and provided information on specific Geri psych facilities and updated him that no bed offers have been offered as of yet.LCSW arranged for Dr Toni Amend to follow up with patients son and information was provided to psychiatrist. Patient will now benefit from Geri Psych placement or ALF- memory care facility which ever comes first.  He reported he saw [facility] and they will come out and assess patient to see if he is suitable for their cottage. LCSW informed pts son that several facilities in [place] have been contacted and we are awaiting a call back.  Son agreed to call if he was contacted and LCSW agreed to contact him if facility is found.   [employer] LCSW [phone]

## 2017-08-20 ENCOUNTER — Emergency Department: Payer: Medicare HMO

## 2017-08-20 DIAGNOSIS — R4020 Unspecified coma: Secondary | ICD-10-CM | POA: Diagnosis not present

## 2017-08-20 DIAGNOSIS — R4182 Altered mental status, unspecified: Secondary | ICD-10-CM | POA: Diagnosis not present

## 2017-08-20 LAB — CBC WITH DIFFERENTIAL/PLATELET
Basophils Absolute: 0.1 10*3/uL (ref 0–0.1)
Basophils Relative: 1 %
EOS ABS: 0.2 10*3/uL (ref 0–0.7)
Eosinophils Relative: 4 %
HCT: 41.3 % (ref 40.0–52.0)
HEMOGLOBIN: 14.5 g/dL (ref 13.0–18.0)
LYMPHS ABS: 1.7 10*3/uL (ref 1.0–3.6)
Lymphocytes Relative: 27 %
MCH: 32.4 pg (ref 26.0–34.0)
MCHC: 35.1 g/dL (ref 32.0–36.0)
MCV: 92.2 fL (ref 80.0–100.0)
MONO ABS: 0.7 10*3/uL (ref 0.2–1.0)
MONOS PCT: 10 %
Neutro Abs: 3.6 10*3/uL (ref 1.4–6.5)
Neutrophils Relative %: 58 %
Platelets: 145 10*3/uL — ABNORMAL LOW (ref 150–440)
RBC: 4.48 MIL/uL (ref 4.40–5.90)
RDW: 14 % (ref 11.5–14.5)
WBC: 6.3 10*3/uL (ref 3.8–10.6)

## 2017-08-20 LAB — COMPREHENSIVE METABOLIC PANEL
ALT: 17 U/L (ref 17–63)
AST: 20 U/L (ref 15–41)
Albumin: 3.4 g/dL — ABNORMAL LOW (ref 3.5–5.0)
Alkaline Phosphatase: 93 U/L (ref 38–126)
Anion gap: 7 (ref 5–15)
BUN: 16 mg/dL (ref 6–20)
CHLORIDE: 109 mmol/L (ref 101–111)
CO2: 27 mmol/L (ref 22–32)
CREATININE: 0.82 mg/dL (ref 0.61–1.24)
Calcium: 9.1 mg/dL (ref 8.9–10.3)
GFR calc non Af Amer: >60 mL/min (ref >60)
GLUCOSE: 96 mg/dL (ref 65–99)
Potassium: 3.7 mmol/L (ref 3.5–5.1)
Sodium: 143 mmol/L (ref 135–145)
Total Bilirubin: 1.4 mg/dL — ABNORMAL HIGH (ref 0.3–1.2)
Total Protein: 6 g/dL — ABNORMAL LOW (ref 6.5–8.1)

## 2017-08-20 LAB — AMMONIA: AMMONIA: 16 umol/L (ref 9–35)

## 2017-08-20 LAB — GLUCOSE, CAPILLARY: Glucose-Capillary: 88 mg/dL (ref 65–99)

## 2017-08-20 MED ORDER — RISPERIDONE 1 MG PO TABS: 0.5000 mg | ORAL_TABLET | Freq: Once | ORAL | Status: DC

## 2017-08-20 MED ORDER — RISPERIDONE 0.5 MG PO TBDP
ORAL_TABLET | ORAL | Status: AC
  Administered 2017-08-20: 0.5 mg
  Filled 2017-08-20: qty 1

## 2017-08-20 MED ORDER — SODIUM CHLORIDE 0.9 % IV BOLUS (SEPSIS)
500.0000 mL | Freq: Once | INTRAVENOUS | Status: AC
  Administered 2017-08-20: 500 mL via INTRAVENOUS

## 2017-08-20 NOTE — ED Notes (Signed)
Patient status improved and to baseline.  Awake and alert, oriented to person.  No longer shaking.  Able to sit up without assistance.  Still c/o slight dizziness.

## 2017-08-20 NOTE — ED Notes (Signed)
Pt urinated and had BM in floor, pt appears confused as to events and is attempting to cover the stool, pt cleaned and clothes changed, linen changed, underwear placed over diaper as pt will removed clothing, EVS called to cleaned room, pt back to sleep after, wristband removed, pt wearing glasses  recommend regular toilet visits

## 2017-08-20 NOTE — ED Provider Notes (Signed)
-----------------------------------------   7:39 PM on 08/20/2017 -----------------------------------------  This evening, patient was resting comfortably but when we try to get him up he was very shaky and unsteady, he would talk but did not want to,. It did appear to psychiatric in origin. I did a CT scan of his head to recheck blood work and checked glucose, all of which was reassuring, at this time he is completely back to baseline. Does not appear to be any acute physiologic change, certainly does not appear to bedelirium as he is back to baseline at this tiery brief interval. Patient was woken from a deep sleep perhaps he was just discombobulated. In any event we will continue to watch him   Jeanmarie Plant, MD 08/20/17 [phone]

## 2017-08-20 NOTE — ED Notes (Signed)
TO CT scan--- °

## 2017-08-20 NOTE — ED Notes (Signed)
Return from CT scan.

## 2017-08-20 NOTE — Progress Notes (Signed)
Carolann Littler at [facility], she was in ED and once she reviews EKG and IVC documentation she will call us back with Accepting physician ( aprox 30 minutes awaiting call back)  Toney Difatta LCSW

## 2017-08-20 NOTE — ED Provider Notes (Addendum)
-----------------------------------------   8:12 AM on 08/20/2017 -----------------------------------------   Blood pressure (!) 146/69, pulse 62, temperature 97.8 F (36.6 C), temperature source Oral, resp. rate 18, height 6' (1.829 m), weight 65.6 kg (144 lb 10 oz), SpO2 94 %.  The patient is beginning to escalate. I will give him an extra dose of Risperdal now.    Merrily Brittle, MD 08/20/17 3888    Merrily Brittle, MD 08/20/17 0813   Nathan Saunders has accepted for [facility].  Accepting physician is Dr. Lowanda Foster.   Merrily Brittle, MD 08/20/17 2800    Merrily Brittle, MD 08/20/17 1123

## 2017-08-20 NOTE — ED Notes (Signed)
Patient agitated.  Insisting he is at his home and he needs to go outside.  Multiple attempts to redirect made, ineffective.  Patient assisted back to room.  Continued to attempt to redirect and reassure patient, ineffective.  Dr. Lamont Snowball notified, Risperdal ordered and administered, see MAR.  Continue to monitor.

## 2017-08-20 NOTE — Progress Notes (Signed)
LCSW faxed over EKG, Chest x ray and IVC documentation and consulted with ED secretary and EDP that patient needs to be transported to [employer] LCSW

## 2017-08-20 NOTE — ED Notes (Signed)
Patient awoken to get dressed for transfer to [facility].  Patient initially a bit sleepy but at baseline, patient then became very unsteady, unable to keep balance while sitting.  Dr. Alphonzo Lemmings alerted and to bedside to evaluate patient.  Continue to monitor.

## 2017-08-20 NOTE — Progress Notes (Signed)
Called [facility] and they do have a male bed available LCSW re faxed information. Patient under review  [employer] LCSW [phone]

## 2017-08-20 NOTE — ED Notes (Signed)
BEHAVIORAL HEALTH ROUNDING  Patient sleeping: No.  Patient alert and oriented: yes  Behavior appropriate: Yes. ; If no, describe:  Nutrition and fluids offered: Yes  Toileting and hygiene offered: Yes  Sitter present: not applicable, Q 15 min safety rounds and observation.  Law enforcement present: Yes ODS  

## 2017-08-20 NOTE — ED Notes (Signed)
BEHAVIORAL HEALTH ROUNDING Patient sleeping: Yes.   Patient alert and oriented: not applicable SLEEPING Behavior appropriate: Yes.  ; If no, describe: SLEEPING Nutrition and fluids offered: No SLEEPING Toileting and hygiene offered: NoSLEEPING Sitter present: not applicable, Q 15 min safety rounds and observation. Law enforcement present: Yes ODS 

## 2017-08-20 NOTE — ED Notes (Addendum)
Report called to [facility].  Will call facility once [facility] arrives for patient transport.  [phone].

## 2017-08-20 NOTE — ED Notes (Signed)
Pt belongings consisting of 1 bag labeled 1 of 1 retr

## 2017-08-20 NOTE — ED Notes (Signed)
ENVIRONMENTAL ASSESSMENT  Potentially harmful objects out of patient reach: Yes.  Personal belongings secured: Yes.  Patient dressed in hospital provided attire only: Yes.  Plastic bags out of patient reach: Yes.  Patient care equipment (cords, cables, call bells, lines, and drains) shortened, removed, or accounted for: Yes.  Equipment and supplies removed from bottom of stretcher: Yes.  Potentially toxic materials out of patient reach: Yes.  Sharps container removed or out of patient reach: Yes.   BEHAVIORAL HEALTH ROUNDING  Patient sleeping: No.  Patient alert and oriented: alert and at his baseline Behavior appropriate: Yes. ; If no, describe:  Nutrition and fluids offered: Yes  Toileting and hygiene offered: Yes  Sitter present: not applicable, Q 15 min safety rounds and observation via security camera. Law enforcement present: Yes ODS  ED BHU PLACEMENT JUSTIFICATION  Is the patient under IVC or is there intent for IVC: Yes.  Is the patient medically cleared: Yes.  Is there vacancy in the ED BHU: Yes.  Is the population mix appropriate for patient: no.  Is the patient awaiting placement in inpatient or outpatient setting: Yes.  Has the patient had a psychiatric consult: Yes.  Survey of unit performed for contraband, proper placement and condition of furniture, tampering with fixtures in bathroom, shower, and each patient room: Yes. ; Findings: All clear  APPEARANCE/BEHAVIOR  calm, cooperative at this time NEURO ASSESSMENT  Orientation: self only, pt has hx of dementia and is at his baseline Hallucinations: No.None noted (Hallucinations)  Speech: Normal  Gait: normal  RESPIRATORY ASSESSMENT  WNL  CARDIOVASCULAR ASSESSMENT  Paced on the monitor GASTROINTESTINAL ASSESSMENT  WNL  EXTREMITIES  WNL  PLAN OF CARE  Provide calm/safe environment. Vital signs assessed TID. ED BHU Assessment once each 12-hour shift. Collaborate with TTS daily or as condition indicates. Assure the  ED provider has rounded once each shift. Provide and encourage hygiene. Provide redirection as needed. Assess for escalating behavior; address immediately and inform ED provider.  Assess family dynamic and appropriateness for visitation as needed: Yes. ; If necessary, describe findings:  Educate the patient/family about BHU procedures/visitation: Yes. ; If necessary, describe findings: Pt is calm and cooperative at this time. Will continue to monitor with Q 15 min safety rounds and observation.

## 2017-08-20 NOTE — Progress Notes (Signed)
LCSW consulted with EDP and EDRN  Received a call back from [place] attached to [facility] care facilities. They are checking all locations for potential beds from [facility] to [facility].  Awaiting call back from [facility]- Patient is being reviewed.  [employer] LCSW [phone]

## 2017-08-20 NOTE — ED Notes (Signed)
BEHAVIORAL HEALTH ROUNDING  Patient sleeping: No.  Patient alert and oriented: yes to his baseline Behavior appropriate: Yes. ; If no, describe:  Nutrition and fluids offered: Yes  Toileting and hygiene offered: Yes  Sitter present: not applicable, Q 15 min safety rounds and observation.  Law enforcement present: Yes ODS  

## 2017-08-20 NOTE — Progress Notes (Signed)
LCSW spoke to patients son and he has been advised that patient has been accepted by [facility] and will be transported by sherrif later this afternoon. TTS was consulted  Tabia Landowski LCSW

## 2017-08-20 NOTE — Progress Notes (Signed)
Patient has been accepted to [facility] : Accepting Physician is Dr Lowanda Foster and call report number is [phone]   Nathan Saunders  [phone]

## 2017-08-20 NOTE — ED Notes (Signed)
[facility] admission line called and notified that patient's status had changed and transfer was held at this time.  Voice mail left.

## 2017-08-21 DIAGNOSIS — E538 Deficiency of other specified B group vitamins: Secondary | ICD-10-CM | POA: Diagnosis not present

## 2017-08-21 DIAGNOSIS — F79 Unspecified intellectual disabilities: Secondary | ICD-10-CM | POA: Diagnosis not present

## 2017-08-21 DIAGNOSIS — Z7982 Long term (current) use of aspirin: Secondary | ICD-10-CM | POA: Diagnosis not present

## 2017-08-21 DIAGNOSIS — R456 Violent behavior: Secondary | ICD-10-CM | POA: Diagnosis not present

## 2017-08-21 DIAGNOSIS — D696 Thrombocytopenia, unspecified: Secondary | ICD-10-CM | POA: Diagnosis not present

## 2017-08-21 DIAGNOSIS — D649 Anemia, unspecified: Secondary | ICD-10-CM | POA: Diagnosis not present

## 2017-08-21 DIAGNOSIS — I10 Essential (primary) hypertension: Secondary | ICD-10-CM | POA: Diagnosis not present

## 2017-08-21 DIAGNOSIS — E876 Hypokalemia: Secondary | ICD-10-CM | POA: Diagnosis not present

## 2017-08-21 DIAGNOSIS — G309 Alzheimer's disease, unspecified: Secondary | ICD-10-CM | POA: Diagnosis not present

## 2017-08-21 DIAGNOSIS — R17 Unspecified jaundice: Secondary | ICD-10-CM | POA: Diagnosis not present

## 2017-08-21 DIAGNOSIS — I25110 Atherosclerotic heart disease of native coronary artery with unstable angina pectoris: Secondary | ICD-10-CM | POA: Diagnosis not present

## 2017-08-21 DIAGNOSIS — Z7901 Long term (current) use of anticoagulants: Secondary | ICD-10-CM | POA: Diagnosis not present

## 2017-08-21 DIAGNOSIS — Z87891 Personal history of nicotine dependence: Secondary | ICD-10-CM | POA: Diagnosis not present

## 2017-08-21 DIAGNOSIS — R9431 Abnormal electrocardiogram [ECG] [EKG]: Secondary | ICD-10-CM | POA: Diagnosis not present

## 2017-08-21 DIAGNOSIS — Z888 Allergy status to other drugs, medicaments and biological substances status: Secondary | ICD-10-CM | POA: Diagnosis not present

## 2017-08-21 DIAGNOSIS — I2510 Atherosclerotic heart disease of native coronary artery without angina pectoris: Secondary | ICD-10-CM | POA: Diagnosis not present

## 2017-08-21 DIAGNOSIS — I482 Chronic atrial fibrillation: Secondary | ICD-10-CM | POA: Diagnosis not present

## 2017-08-21 DIAGNOSIS — Z950 Presence of cardiac pacemaker: Secondary | ICD-10-CM | POA: Diagnosis not present

## 2017-08-21 DIAGNOSIS — E559 Vitamin D deficiency, unspecified: Secondary | ICD-10-CM | POA: Diagnosis not present

## 2017-08-21 DIAGNOSIS — F0391 Unspecified dementia with behavioral disturbance: Secondary | ICD-10-CM | POA: Diagnosis not present

## 2017-08-21 DIAGNOSIS — Z79899 Other long term (current) drug therapy: Secondary | ICD-10-CM | POA: Diagnosis not present

## 2017-08-21 DIAGNOSIS — E785 Hyperlipidemia, unspecified: Secondary | ICD-10-CM | POA: Diagnosis not present

## 2017-08-21 DIAGNOSIS — F0281 Dementia in other diseases classified elsewhere with behavioral disturbance: Secondary | ICD-10-CM | POA: Diagnosis not present

## 2017-08-21 NOTE — ED Notes (Signed)
BEHAVIORAL HEALTH ROUNDING Patient sleeping: Yes.   Patient alert and oriented: not applicable SLEEPING Behavior appropriate: Yes.  ; If no, describe: SLEEPING Nutrition and fluids offered: No SLEEPING Toileting and hygiene offered: NoSLEEPING Sitter present: not applicable, Q 15 min safety rounds and observation. Law enforcement present: Yes ODS 

## 2017-08-21 NOTE — ED Notes (Signed)
Secretary spoke with sheriff's office - their transporter is due back around 4pm and after that time they should know when they will be picking up patient

## 2017-08-21 NOTE — ED Notes (Signed)
Meal tray placed in pts room. Pt still sleeping at this time  

## 2017-08-21 NOTE — ED Notes (Signed)
Pt iv'd d/c and pt being transported by sheriff's office. [place] made aware.

## 2017-08-21 NOTE — Progress Notes (Signed)
LCSW received another from patients son and he was wondering when sheriff will arrive, explained that ED secretary has made the request at 7am and called again at 9am and sheriff will come when he comes. It was explained that as soon as transport arrives LCSW will call patients son and let him know.  Schuyler Olden LCSW

## 2017-08-21 NOTE — ED Notes (Signed)
Spoke with Efraim Kaufmann, RN at [facility] and gave updated report on pt. Will call her back when pt is on the way to [facility].

## 2017-08-21 NOTE — ED Provider Notes (Signed)
-----------------------------------------   6:24 AM on 08/21/2017 -----------------------------------------   Blood pressure (!) 123/57, pulse 60, temperature 98 F (36.7 C), temperature source Oral, resp. rate 14, height 6' (1.829 m), weight 65.6 kg (144 lb 10 oz), SpO2 98 %.  The patient had no acute events since last update.  Got a little agitated when he wondered into another patient's room but was easily redirected. Resting at this time without distress. I am told he is to be transported to [place] later today.  Disposition is pending Psychiatry/Behavioral Medicine team recommendations.     Irean Hong, MD 08/21/17 [phone]

## 2017-08-21 NOTE — ED Notes (Signed)
Pt awaiting sheriff's office for transport to [place]. Spoke with Sandre Kitty - we will call them when transport arrives [phone]

## 2017-08-21 NOTE — ED Notes (Signed)
Pt refused breakfast and lunch stating "it's too early". Has been drinking water with water at bedside

## 2017-08-21 NOTE — Progress Notes (Addendum)
LCSW received call from patients son it was explained that patient was not transported to [facility] yesterday as pt needed additional medical clearance. It was explained patient would be transported by sheriff later today. No further needs   [employer] LCSW [phone]

## 2017-08-21 NOTE — ED Notes (Signed)
BEHAVIORAL HEALTH ROUNDING  Patient sleeping: No.  Patient alert and oriented: yes  Behavior appropriate: Yes. ; If no, describe:  Nutrition and fluids offered: Yes  Toileting and hygiene offered: Yes  Sitter present: not applicable, Q 15 min safety rounds and observation.  Law enforcement present: Yes ODS  

## 2017-09-06 DIAGNOSIS — I2510 Atherosclerotic heart disease of native coronary artery without angina pectoris: Secondary | ICD-10-CM | POA: Diagnosis not present

## 2017-09-06 DIAGNOSIS — I4891 Unspecified atrial fibrillation: Secondary | ICD-10-CM | POA: Diagnosis not present

## 2017-09-06 DIAGNOSIS — D518 Other vitamin B12 deficiency anemias: Secondary | ICD-10-CM | POA: Diagnosis not present

## 2017-09-06 DIAGNOSIS — D6489 Other specified anemias: Secondary | ICD-10-CM | POA: Diagnosis not present

## 2017-09-06 DIAGNOSIS — I10 Essential (primary) hypertension: Secondary | ICD-10-CM | POA: Diagnosis not present

## 2017-09-06 DIAGNOSIS — K219 Gastro-esophageal reflux disease without esophagitis: Secondary | ICD-10-CM | POA: Diagnosis not present

## 2017-09-06 DIAGNOSIS — D696 Thrombocytopenia, unspecified: Secondary | ICD-10-CM | POA: Diagnosis not present

## 2017-09-06 DIAGNOSIS — E559 Vitamin D deficiency, unspecified: Secondary | ICD-10-CM | POA: Diagnosis not present

## 2017-09-07 DIAGNOSIS — Z79899 Other long term (current) drug therapy: Secondary | ICD-10-CM | POA: Diagnosis not present

## 2017-09-27 DIAGNOSIS — E559 Vitamin D deficiency, unspecified: Secondary | ICD-10-CM | POA: Diagnosis not present

## 2017-09-27 DIAGNOSIS — K219 Gastro-esophageal reflux disease without esophagitis: Secondary | ICD-10-CM | POA: Diagnosis not present

## 2017-09-27 DIAGNOSIS — R112 Nausea with vomiting, unspecified: Secondary | ICD-10-CM | POA: Diagnosis not present

## 2017-09-27 DIAGNOSIS — I2510 Atherosclerotic heart disease of native coronary artery without angina pectoris: Secondary | ICD-10-CM | POA: Diagnosis not present

## 2017-09-27 DIAGNOSIS — I10 Essential (primary) hypertension: Secondary | ICD-10-CM | POA: Diagnosis not present

## 2017-09-27 DIAGNOSIS — G4709 Other insomnia: Secondary | ICD-10-CM | POA: Diagnosis not present

## 2017-09-27 DIAGNOSIS — I4891 Unspecified atrial fibrillation: Secondary | ICD-10-CM | POA: Diagnosis not present

## 2017-09-27 DIAGNOSIS — R32 Unspecified urinary incontinence: Secondary | ICD-10-CM | POA: Diagnosis not present

## 2017-09-27 DIAGNOSIS — F0150 Vascular dementia without behavioral disturbance: Secondary | ICD-10-CM | POA: Diagnosis not present

## 2017-10-17 DIAGNOSIS — B351 Tinea unguium: Secondary | ICD-10-CM | POA: Diagnosis not present

## 2017-10-17 DIAGNOSIS — I70293 Other atherosclerosis of native arteries of extremities, bilateral legs: Secondary | ICD-10-CM | POA: Diagnosis not present

## 2017-10-17 DIAGNOSIS — R262 Difficulty in walking, not elsewhere classified: Secondary | ICD-10-CM | POA: Diagnosis not present

## 2017-10-27 DIAGNOSIS — I4589 Other specified conduction disorders: Secondary | ICD-10-CM | POA: Diagnosis not present

## 2017-10-27 DIAGNOSIS — R072 Precordial pain: Secondary | ICD-10-CM | POA: Diagnosis not present

## 2017-10-27 DIAGNOSIS — R079 Chest pain, unspecified: Secondary | ICD-10-CM | POA: Diagnosis not present

## 2017-10-27 DIAGNOSIS — Z951 Presence of aortocoronary bypass graft: Secondary | ICD-10-CM | POA: Diagnosis not present

## 2017-10-27 DIAGNOSIS — Z955 Presence of coronary angioplasty implant and graft: Secondary | ICD-10-CM | POA: Diagnosis not present

## 2017-10-27 DIAGNOSIS — F0390 Unspecified dementia without behavioral disturbance: Secondary | ICD-10-CM | POA: Diagnosis not present

## 2017-10-27 DIAGNOSIS — Z66 Do not resuscitate: Secondary | ICD-10-CM | POA: Diagnosis not present

## 2017-10-27 DIAGNOSIS — Z950 Presence of cardiac pacemaker: Secondary | ICD-10-CM | POA: Diagnosis not present

## 2017-10-27 DIAGNOSIS — Z7901 Long term (current) use of anticoagulants: Secondary | ICD-10-CM | POA: Diagnosis not present

## 2017-10-27 DIAGNOSIS — I2510 Atherosclerotic heart disease of native coronary artery without angina pectoris: Secondary | ICD-10-CM | POA: Diagnosis not present

## 2017-10-27 DIAGNOSIS — I482 Chronic atrial fibrillation: Secondary | ICD-10-CM | POA: Diagnosis not present

## 2017-10-27 DIAGNOSIS — N400 Enlarged prostate without lower urinary tract symptoms: Secondary | ICD-10-CM | POA: Diagnosis not present

## 2017-10-27 DIAGNOSIS — I4891 Unspecified atrial fibrillation: Secondary | ICD-10-CM | POA: Diagnosis not present

## 2017-10-27 DIAGNOSIS — I10 Essential (primary) hypertension: Secondary | ICD-10-CM | POA: Diagnosis not present

## 2017-10-27 DIAGNOSIS — R778 Other specified abnormalities of plasma proteins: Secondary | ICD-10-CM | POA: Diagnosis not present

## 2017-10-27 DIAGNOSIS — R5381 Other malaise: Secondary | ICD-10-CM | POA: Diagnosis not present

## 2017-10-28 DIAGNOSIS — Z955 Presence of coronary angioplasty implant and graft: Secondary | ICD-10-CM | POA: Diagnosis not present

## 2017-10-28 DIAGNOSIS — F0390 Unspecified dementia without behavioral disturbance: Secondary | ICD-10-CM | POA: Diagnosis not present

## 2017-10-28 DIAGNOSIS — I4891 Unspecified atrial fibrillation: Secondary | ICD-10-CM | POA: Diagnosis not present

## 2017-10-28 DIAGNOSIS — N400 Enlarged prostate without lower urinary tract symptoms: Secondary | ICD-10-CM | POA: Diagnosis not present

## 2017-10-28 DIAGNOSIS — I2510 Atherosclerotic heart disease of native coronary artery without angina pectoris: Secondary | ICD-10-CM | POA: Diagnosis not present

## 2017-10-28 DIAGNOSIS — R5381 Other malaise: Secondary | ICD-10-CM | POA: Diagnosis not present

## 2017-10-28 DIAGNOSIS — R072 Precordial pain: Secondary | ICD-10-CM | POA: Diagnosis not present

## 2017-10-28 DIAGNOSIS — I10 Essential (primary) hypertension: Secondary | ICD-10-CM | POA: Diagnosis not present

## 2017-10-28 DIAGNOSIS — Z7901 Long term (current) use of anticoagulants: Secondary | ICD-10-CM | POA: Diagnosis not present

## 2017-10-28 DIAGNOSIS — Z951 Presence of aortocoronary bypass graft: Secondary | ICD-10-CM | POA: Diagnosis not present

## 2017-11-01 DIAGNOSIS — I10 Essential (primary) hypertension: Secondary | ICD-10-CM | POA: Diagnosis not present

## 2017-11-01 DIAGNOSIS — F0151 Vascular dementia with behavioral disturbance: Secondary | ICD-10-CM | POA: Diagnosis not present

## 2017-11-01 DIAGNOSIS — I2510 Atherosclerotic heart disease of native coronary artery without angina pectoris: Secondary | ICD-10-CM | POA: Diagnosis not present

## 2017-11-01 DIAGNOSIS — K219 Gastro-esophageal reflux disease without esophagitis: Secondary | ICD-10-CM | POA: Diagnosis not present

## 2017-11-01 DIAGNOSIS — I4891 Unspecified atrial fibrillation: Secondary | ICD-10-CM | POA: Diagnosis not present

## 2017-11-08 DIAGNOSIS — R2241 Localized swelling, mass and lump, right lower limb: Secondary | ICD-10-CM | POA: Diagnosis not present

## 2017-11-08 DIAGNOSIS — R079 Chest pain, unspecified: Secondary | ICD-10-CM | POA: Diagnosis not present

## 2017-11-08 DIAGNOSIS — M79661 Pain in right lower leg: Secondary | ICD-10-CM | POA: Diagnosis not present

## 2017-11-08 DIAGNOSIS — I4891 Unspecified atrial fibrillation: Secondary | ICD-10-CM | POA: Diagnosis not present

## 2017-11-08 DIAGNOSIS — F0151 Vascular dementia with behavioral disturbance: Secondary | ICD-10-CM | POA: Diagnosis not present

## 2017-11-08 DIAGNOSIS — M25571 Pain in right ankle and joints of right foot: Secondary | ICD-10-CM | POA: Diagnosis not present

## 2017-11-08 DIAGNOSIS — I10 Essential (primary) hypertension: Secondary | ICD-10-CM | POA: Diagnosis not present

## 2017-11-08 DIAGNOSIS — I2510 Atherosclerotic heart disease of native coronary artery without angina pectoris: Secondary | ICD-10-CM | POA: Diagnosis not present

## 2017-11-08 DIAGNOSIS — M79671 Pain in right foot: Secondary | ICD-10-CM | POA: Diagnosis not present

## 2017-11-09 DIAGNOSIS — N400 Enlarged prostate without lower urinary tract symptoms: Secondary | ICD-10-CM | POA: Diagnosis not present

## 2017-11-09 DIAGNOSIS — K219 Gastro-esophageal reflux disease without esophagitis: Secondary | ICD-10-CM | POA: Diagnosis not present

## 2017-11-09 DIAGNOSIS — M6281 Muscle weakness (generalized): Secondary | ICD-10-CM | POA: Diagnosis not present

## 2017-11-09 DIAGNOSIS — Z955 Presence of coronary angioplasty implant and graft: Secondary | ICD-10-CM | POA: Diagnosis not present

## 2017-11-09 DIAGNOSIS — E870 Hyperosmolality and hypernatremia: Secondary | ICD-10-CM | POA: Diagnosis not present

## 2017-11-09 DIAGNOSIS — Z515 Encounter for palliative care: Secondary | ICD-10-CM | POA: Diagnosis not present

## 2017-11-09 DIAGNOSIS — R6520 Severe sepsis without septic shock: Secondary | ICD-10-CM | POA: Diagnosis not present

## 2017-11-09 DIAGNOSIS — E785 Hyperlipidemia, unspecified: Secondary | ICD-10-CM | POA: Diagnosis not present

## 2017-11-09 DIAGNOSIS — J9811 Atelectasis: Secondary | ICD-10-CM | POA: Diagnosis not present

## 2017-11-09 DIAGNOSIS — F0151 Vascular dementia with behavioral disturbance: Secondary | ICD-10-CM | POA: Diagnosis not present

## 2017-11-09 DIAGNOSIS — Z950 Presence of cardiac pacemaker: Secondary | ICD-10-CM | POA: Diagnosis not present

## 2017-11-09 DIAGNOSIS — R509 Fever, unspecified: Secondary | ICD-10-CM | POA: Diagnosis not present

## 2017-11-09 DIAGNOSIS — R0989 Other specified symptoms and signs involving the circulatory and respiratory systems: Secondary | ICD-10-CM | POA: Diagnosis not present

## 2017-11-09 DIAGNOSIS — R4020 Unspecified coma: Secondary | ICD-10-CM | POA: Diagnosis not present

## 2017-11-09 DIAGNOSIS — M7989 Other specified soft tissue disorders: Secondary | ICD-10-CM | POA: Diagnosis not present

## 2017-11-09 DIAGNOSIS — R1312 Dysphagia, oropharyngeal phase: Secondary | ICD-10-CM | POA: Diagnosis not present

## 2017-11-09 DIAGNOSIS — R404 Transient alteration of awareness: Secondary | ICD-10-CM | POA: Diagnosis not present

## 2017-11-09 DIAGNOSIS — R498 Other voice and resonance disorders: Secondary | ICD-10-CM | POA: Diagnosis not present

## 2017-11-09 DIAGNOSIS — I2510 Atherosclerotic heart disease of native coronary artery without angina pectoris: Secondary | ICD-10-CM | POA: Diagnosis not present

## 2017-11-09 DIAGNOSIS — R4182 Altered mental status, unspecified: Secondary | ICD-10-CM | POA: Diagnosis not present

## 2017-11-09 DIAGNOSIS — E876 Hypokalemia: Secondary | ICD-10-CM | POA: Diagnosis not present

## 2017-11-09 DIAGNOSIS — I340 Nonrheumatic mitral (valve) insufficiency: Secondary | ICD-10-CM | POA: Diagnosis not present

## 2017-11-09 DIAGNOSIS — L03115 Cellulitis of right lower limb: Secondary | ICD-10-CM | POA: Diagnosis not present

## 2017-11-09 DIAGNOSIS — F0150 Vascular dementia without behavioral disturbance: Secondary | ICD-10-CM | POA: Diagnosis not present

## 2017-11-09 DIAGNOSIS — R41841 Cognitive communication deficit: Secondary | ICD-10-CM | POA: Diagnosis not present

## 2017-11-09 DIAGNOSIS — J9601 Acute respiratory failure with hypoxia: Secondary | ICD-10-CM | POA: Diagnosis not present

## 2017-11-09 DIAGNOSIS — F0391 Unspecified dementia with behavioral disturbance: Secondary | ICD-10-CM | POA: Diagnosis not present

## 2017-11-09 DIAGNOSIS — F05 Delirium due to known physiological condition: Secondary | ICD-10-CM | POA: Diagnosis not present

## 2017-11-09 DIAGNOSIS — D72829 Elevated white blood cell count, unspecified: Secondary | ICD-10-CM | POA: Diagnosis not present

## 2017-11-09 DIAGNOSIS — I480 Paroxysmal atrial fibrillation: Secondary | ICD-10-CM | POA: Diagnosis not present

## 2017-11-09 DIAGNOSIS — I361 Nonrheumatic tricuspid (valve) insufficiency: Secondary | ICD-10-CM | POA: Diagnosis not present

## 2017-11-09 DIAGNOSIS — Z951 Presence of aortocoronary bypass graft: Secondary | ICD-10-CM | POA: Diagnosis not present

## 2017-11-09 DIAGNOSIS — N179 Acute kidney failure, unspecified: Secondary | ICD-10-CM | POA: Diagnosis not present

## 2017-11-09 DIAGNOSIS — R2689 Other abnormalities of gait and mobility: Secondary | ICD-10-CM | POA: Diagnosis not present

## 2017-11-09 DIAGNOSIS — I5020 Unspecified systolic (congestive) heart failure: Secondary | ICD-10-CM | POA: Diagnosis not present

## 2017-11-09 DIAGNOSIS — J90 Pleural effusion, not elsewhere classified: Secondary | ICD-10-CM | POA: Diagnosis not present

## 2017-11-09 DIAGNOSIS — I517 Cardiomegaly: Secondary | ICD-10-CM | POA: Diagnosis not present

## 2017-11-09 DIAGNOSIS — R600 Localized edema: Secondary | ICD-10-CM | POA: Diagnosis not present

## 2017-11-09 DIAGNOSIS — Z9981 Dependence on supplemental oxygen: Secondary | ICD-10-CM | POA: Diagnosis not present

## 2017-11-09 DIAGNOSIS — F0390 Unspecified dementia without behavioral disturbance: Secondary | ICD-10-CM | POA: Diagnosis not present

## 2017-11-09 DIAGNOSIS — I482 Chronic atrial fibrillation: Secondary | ICD-10-CM | POA: Diagnosis not present

## 2017-11-09 DIAGNOSIS — I110 Hypertensive heart disease with heart failure: Secondary | ICD-10-CM | POA: Diagnosis not present

## 2017-11-09 DIAGNOSIS — I5022 Chronic systolic (congestive) heart failure: Secondary | ICD-10-CM | POA: Diagnosis not present

## 2017-11-09 DIAGNOSIS — I442 Atrioventricular block, complete: Secondary | ICD-10-CM | POA: Diagnosis not present

## 2017-11-09 DIAGNOSIS — Z66 Do not resuscitate: Secondary | ICD-10-CM | POA: Diagnosis not present

## 2017-11-09 DIAGNOSIS — R0902 Hypoxemia: Secondary | ICD-10-CM | POA: Diagnosis not present

## 2017-11-09 DIAGNOSIS — L03119 Cellulitis of unspecified part of limb: Secondary | ICD-10-CM | POA: Diagnosis not present

## 2017-11-09 DIAGNOSIS — A419 Sepsis, unspecified organism: Secondary | ICD-10-CM | POA: Diagnosis not present

## 2017-11-09 DIAGNOSIS — R0602 Shortness of breath: Secondary | ICD-10-CM | POA: Diagnosis not present

## 2017-11-09 DIAGNOSIS — R638 Other symptoms and signs concerning food and fluid intake: Secondary | ICD-10-CM | POA: Diagnosis not present

## 2017-11-09 DIAGNOSIS — R410 Disorientation, unspecified: Secondary | ICD-10-CM | POA: Diagnosis not present

## 2017-11-09 DIAGNOSIS — J690 Pneumonitis due to inhalation of food and vomit: Secondary | ICD-10-CM | POA: Diagnosis not present

## 2017-11-09 DIAGNOSIS — I4892 Unspecified atrial flutter: Secondary | ICD-10-CM | POA: Diagnosis not present

## 2017-11-09 DIAGNOSIS — Z7409 Other reduced mobility: Secondary | ICD-10-CM | POA: Diagnosis not present

## 2017-11-21 ENCOUNTER — Ambulatory Visit: Payer: Medicare HMO | Admitting: Adult Health

## 2017-11-25 DIAGNOSIS — R498 Other voice and resonance disorders: Secondary | ICD-10-CM | POA: Diagnosis not present

## 2017-11-25 DIAGNOSIS — J690 Pneumonitis due to inhalation of food and vomit: Secondary | ICD-10-CM | POA: Diagnosis not present

## 2017-11-25 DIAGNOSIS — R638 Other symptoms and signs concerning food and fluid intake: Secondary | ICD-10-CM | POA: Diagnosis not present

## 2017-11-25 DIAGNOSIS — R6520 Severe sepsis without septic shock: Secondary | ICD-10-CM | POA: Diagnosis not present

## 2017-11-25 DIAGNOSIS — R21 Rash and other nonspecific skin eruption: Secondary | ICD-10-CM | POA: Diagnosis not present

## 2017-11-25 DIAGNOSIS — M6281 Muscle weakness (generalized): Secondary | ICD-10-CM | POA: Diagnosis not present

## 2017-11-25 DIAGNOSIS — I110 Hypertensive heart disease with heart failure: Secondary | ICD-10-CM | POA: Diagnosis not present

## 2017-11-25 DIAGNOSIS — I5020 Unspecified systolic (congestive) heart failure: Secondary | ICD-10-CM | POA: Diagnosis not present

## 2017-11-25 DIAGNOSIS — R630 Anorexia: Secondary | ICD-10-CM | POA: Diagnosis not present

## 2017-11-25 DIAGNOSIS — R2689 Other abnormalities of gait and mobility: Secondary | ICD-10-CM | POA: Diagnosis not present

## 2017-11-25 DIAGNOSIS — F0150 Vascular dementia without behavioral disturbance: Secondary | ICD-10-CM | POA: Diagnosis not present

## 2017-11-25 DIAGNOSIS — N400 Enlarged prostate without lower urinary tract symptoms: Secondary | ICD-10-CM | POA: Diagnosis not present

## 2017-11-25 DIAGNOSIS — I4892 Unspecified atrial flutter: Secondary | ICD-10-CM | POA: Diagnosis not present

## 2017-11-25 DIAGNOSIS — I442 Atrioventricular block, complete: Secondary | ICD-10-CM | POA: Diagnosis not present

## 2017-11-25 DIAGNOSIS — R4182 Altered mental status, unspecified: Secondary | ICD-10-CM | POA: Diagnosis not present

## 2017-11-25 DIAGNOSIS — R1312 Dysphagia, oropharyngeal phase: Secondary | ICD-10-CM | POA: Diagnosis not present

## 2017-11-25 DIAGNOSIS — L03115 Cellulitis of right lower limb: Secondary | ICD-10-CM | POA: Diagnosis not present

## 2017-11-25 DIAGNOSIS — Z7409 Other reduced mobility: Secondary | ICD-10-CM | POA: Diagnosis not present

## 2017-11-25 DIAGNOSIS — J181 Lobar pneumonia, unspecified organism: Secondary | ICD-10-CM | POA: Diagnosis not present

## 2017-11-25 DIAGNOSIS — R627 Adult failure to thrive: Secondary | ICD-10-CM | POA: Diagnosis not present

## 2017-11-25 DIAGNOSIS — R5383 Other fatigue: Secondary | ICD-10-CM | POA: Diagnosis not present

## 2017-11-25 DIAGNOSIS — I2510 Atherosclerotic heart disease of native coronary artery without angina pectoris: Secondary | ICD-10-CM | POA: Diagnosis not present

## 2017-11-25 DIAGNOSIS — I4891 Unspecified atrial fibrillation: Secondary | ICD-10-CM | POA: Diagnosis not present

## 2017-11-25 DIAGNOSIS — R41841 Cognitive communication deficit: Secondary | ICD-10-CM | POA: Diagnosis not present

## 2017-11-28 ENCOUNTER — Other Ambulatory Visit: Payer: Self-pay

## 2017-11-28 ENCOUNTER — Other Ambulatory Visit: Payer: Self-pay | Admitting: *Deleted

## 2017-11-28 DIAGNOSIS — F0150 Vascular dementia without behavioral disturbance: Secondary | ICD-10-CM | POA: Diagnosis not present

## 2017-11-28 DIAGNOSIS — R5383 Other fatigue: Secondary | ICD-10-CM | POA: Diagnosis not present

## 2017-11-28 DIAGNOSIS — L03115 Cellulitis of right lower limb: Secondary | ICD-10-CM | POA: Diagnosis not present

## 2017-11-28 DIAGNOSIS — J181 Lobar pneumonia, unspecified organism: Secondary | ICD-10-CM | POA: Diagnosis not present

## 2017-11-28 NOTE — Patient Outreach (Signed)
[facility]N) Care Management  11/28/2017  Natale LayGordon B Wigger January 08, 1932 [id number]   Phone call to the [facility] discharge planner to discuss long term plan for patient. Discharge planner not available, message could not be left as voicemail was full.  Plan: This social worker to plan to visit patient on 11/29/17.   Adriana ReamsChrystal Jeric Slagel, LCSW [facility] Care Management [phone]

## 2017-11-28 NOTE — Patient Outreach (Signed)
[facility]) Care Management  11/28/2017  Nathan Saunders [birth year] [id number]  Transition of care  Referral date: 11/28/17 Referral source: [facility]/17 Insurance: [employer] #1  Telephone call to patient regarding transition of care referral. Contact answering phone states he is patients son and health care power of attorney. Contact states [facility] is [facility].  Informed contact I would not be able to discuss any information regarding Mr. Nathan ShelterGordon due to not having health care power of attorney on record.   PLAN: RNCM will [facility] for [facility].   Nathan InaDavina Maynard David RN,BSN,CCM [facility] Telephonic  [phone]

## 2017-11-29 ENCOUNTER — Ambulatory Visit: Payer: Self-pay | Admitting: *Deleted

## 2017-11-30 ENCOUNTER — Other Ambulatory Visit: Payer: Self-pay | Admitting: *Deleted

## 2017-11-30 DIAGNOSIS — J181 Lobar pneumonia, unspecified organism: Secondary | ICD-10-CM | POA: Diagnosis not present

## 2017-11-30 DIAGNOSIS — L03115 Cellulitis of right lower limb: Secondary | ICD-10-CM | POA: Diagnosis not present

## 2017-11-30 DIAGNOSIS — F0150 Vascular dementia without behavioral disturbance: Secondary | ICD-10-CM | POA: Diagnosis not present

## 2017-11-30 DIAGNOSIS — R5383 Other fatigue: Secondary | ICD-10-CM | POA: Diagnosis not present

## 2017-11-30 NOTE — Patient Outreach (Addendum)
[facility]) Care Management  11/30/2017  Nathan Saunders [birth year] [id number]  Phone call to [facility] to obtain a update on patient's progress in rehab. Spoke with discharge planner Nathan Saunders who stated that patient's social worker was not in,however the admissions director may have more information regarding patient's capacity. Per admissions director, patient  does not have capacity to discuss his plan of care and recommended that  I  speak to patient's son.   Phone call to patient's son, to discuss discharge plan [facility]. Per patient's son patient's Dementia has progressed and he will most likely not return [facility].  Per patient's son, the long term care plan is not determined but it s anticipated that a long term care skilled facility will be recommended.  Patient is very weak, bed bound and not able to participate in therapy. He  is also not eating much. Per Patient's son, he definitely  cannot manage him at home. He has a meeting with the discharge planner at [facility] on 12/05/17 to formulate a long term plan for either facility care or Hospice.    Plan: This social worker will follow up with patient's son on Tuesday following meeting with discharge planner regarding discharge plan.   Adriana ReamsChrystal Land, LCSW [facility] Care Management [phone]

## 2017-12-02 ENCOUNTER — Ambulatory Visit: Payer: Self-pay | Admitting: *Deleted

## 2017-12-02 DIAGNOSIS — I4891 Unspecified atrial fibrillation: Secondary | ICD-10-CM | POA: Diagnosis not present

## 2017-12-02 DIAGNOSIS — R5383 Other fatigue: Secondary | ICD-10-CM | POA: Diagnosis not present

## 2017-12-02 DIAGNOSIS — J181 Lobar pneumonia, unspecified organism: Secondary | ICD-10-CM | POA: Diagnosis not present

## 2017-12-02 DIAGNOSIS — F0150 Vascular dementia without behavioral disturbance: Secondary | ICD-10-CM | POA: Diagnosis not present

## 2017-12-06 ENCOUNTER — Other Ambulatory Visit: Payer: Self-pay | Admitting: *Deleted

## 2017-12-06 DIAGNOSIS — R630 Anorexia: Secondary | ICD-10-CM | POA: Diagnosis not present

## 2017-12-06 DIAGNOSIS — F0150 Vascular dementia without behavioral disturbance: Secondary | ICD-10-CM | POA: Diagnosis not present

## 2017-12-06 NOTE — Patient Outreach (Signed)
[facility]) Care Management  12/06/2017  Nathan Saunders September 07, 1932 [id number]  Patient referred to this social worker for skilled nursing facility follow up.  Phone call to patient's son to follow up on care plan meeting scheduled for 12/05/17. Per patient's son, due to inclement weather the meting was cancelled and has been rescheduled for 12/08/17 at 12 pm.   Plan: This social worker to follow up with patient's son following care planning meeting on 12/08/17.   Adriana ReamsChrystal Shey Bartmess, LCSW [facility] Care Management [phone]

## 2017-12-07 DIAGNOSIS — R21 Rash and other nonspecific skin eruption: Secondary | ICD-10-CM | POA: Diagnosis not present

## 2017-12-08 ENCOUNTER — Ambulatory Visit: Payer: Self-pay | Admitting: *Deleted

## 2017-12-09 ENCOUNTER — Ambulatory Visit: Payer: Medicare HMO | Admitting: *Deleted

## 2017-12-09 ENCOUNTER — Other Ambulatory Visit: Payer: Self-pay | Admitting: *Deleted

## 2017-12-09 NOTE — Patient Outreach (Signed)
[facility]) Care Management  12/09/2017  Natale LayGordon B Lippe [birth year] [id number]     Patient referred to this social worker for skilled nursing facility follow up. Per patient's son, patient's Dementia has progressed and he will most likely not return [facility].  Per patient's son, the long term care plan is not determined but it s anticipated that a long term care skilled facility will be recommended.  Patient is very weak, bed bound and not able to participate in therapy. He  is also not eating much. Per Patient's son, he definitely  cannot manage him at home. Multiple meetings scheduled  with the discharge planner at [facility]  to formulate a long term plan for either facility care or Hospice, however they have been re-scheduled due to inclement weather.  Per patient, the care plan meeting has been re-scheduled for today at 3:00pm.    Plan: This social worker to follow up with patient's son following care planning meeting on 12/12/17.   Adriana ReamsChrystal Edith Lord, LCSW [facility] Care Management [phone]

## 2017-12-12 ENCOUNTER — Other Ambulatory Visit: Payer: Self-pay | Admitting: *Deleted

## 2017-12-12 NOTE — Patient Outreach (Signed)
[facility]) Care Management  12/12/2017  Nathan Saunders 1932-08-19 [id number]   Phone call to patient's son to follow up on care planning meeting that took place last week on patient's behalf. Per patient's son, he is still not really sure of next steps however reports that patient is losing weight, he is not able to feed himself or hold any weight. Per patient's son, he is being tested for c-diff due to frequent episodes of diarrhea. Per patient's son, patient will not be able to discharge back to [facility] and he would like for patient to be discharged to a facility in [place] or [place]. Per patient's son, patient will be assessed for Palliative care /Hospice.  Once patient's rehab stay is complete he will have to transfer to a long term care facility.Patient's son does not have a place in mind at this time.   Plan: This Child psychotherapistsocial worker to confirm long term care plan for patient with discharge planner at the facility within 1 week.   Adriana ReamsChrystal Aisley Whan, LCSW [facility] Care Management [phone]

## 2017-12-14 ENCOUNTER — Encounter: Payer: Self-pay | Admitting: *Deleted

## 2017-12-14 ENCOUNTER — Other Ambulatory Visit: Payer: Self-pay | Admitting: *Deleted

## 2017-12-14 NOTE — Patient Outreach (Signed)
[facility]) Care Management  12/14/2017  Natale LayGordon B Ranker [birth year] [id number]   Late Entry Phone call to to the discharge planner Lequita HaltMorgan at [facility] to discuss patient's progress in rehab. Per Aundra MilletMegan, patient will have a Hospice assessment either today or tomorrow. If patient is not eligible for Hospice services, she will assist patient's family in identifying long term care in [place] or [place]. Per Aundra MilletMegan, she will call patient's family and reiterate next steps for patient and to provide assistance where needed.  Decision to transfer patient to [facility] in [place] for long term care, patient is not eligible for [facility] hospice home at this time but the plan is for patient to be re-evaluated for Hospice services upon his transfer to [facility]. Patient's son plans to privately pay.  Per patient's son,  patient is very weak, he is not walking or eating and speaks at a whisper. He does not foresee  patient discharging home.   Plan: Patient to be closed to social work at this time due to plan to discharge to [facility] under long term care.   Adriana ReamsChrystal Nyx Keady, LCSW [facility] Care Management [phone]

## 2017-12-15 DIAGNOSIS — R627 Adult failure to thrive: Secondary | ICD-10-CM | POA: Diagnosis not present

## 2017-12-19 DIAGNOSIS — I10 Essential (primary) hypertension: Secondary | ICD-10-CM | POA: Diagnosis not present

## 2017-12-19 DIAGNOSIS — I2510 Atherosclerotic heart disease of native coronary artery without angina pectoris: Secondary | ICD-10-CM | POA: Diagnosis not present

## 2017-12-19 DIAGNOSIS — I5022 Chronic systolic (congestive) heart failure: Secondary | ICD-10-CM | POA: Diagnosis not present

## 2017-12-19 DIAGNOSIS — R1312 Dysphagia, oropharyngeal phase: Secondary | ICD-10-CM | POA: Diagnosis not present

## 2017-12-19 DIAGNOSIS — J690 Pneumonitis due to inhalation of food and vomit: Secondary | ICD-10-CM | POA: Diagnosis not present

## 2017-12-19 DIAGNOSIS — M6281 Muscle weakness (generalized): Secondary | ICD-10-CM | POA: Diagnosis not present

## 2017-12-19 DIAGNOSIS — F0150 Vascular dementia without behavioral disturbance: Secondary | ICD-10-CM | POA: Diagnosis not present

## 2017-12-19 DIAGNOSIS — Z9181 History of falling: Secondary | ICD-10-CM | POA: Diagnosis not present

## 2017-12-21 ENCOUNTER — Other Ambulatory Visit: Payer: Self-pay

## 2017-12-21 NOTE — Patient Outreach (Signed)
[facility]) Care Management  12/21/2017  Natale LayGordon B Brosh 09-15-1932 [id number]   Telephone phone call to patient's son Valente Daviderry Michael.  He is able to verify HIPAA.  He states that [facility]. He states that they are evaluating his dad but he states that SW [employer]Chrystal Land was working with them.  He asked that I let her know where patient is as he was to let her know where his dad went.  Advised that CM would let her know. He states he has the desire to move his dad closer to home when he can.  He offers no concerns at this time.    Plan: RN CM will notify SW of patient location. RN CM will close case at this time and notify care management assistant of case status.  Bary Lericheionne J Taralynn Quiett, RN, MSN [facility] Care Management Care Management Coordinator Direct Line [phone] Toll Free: [phone]  Fax: [phone]

## 2017-12-22 DIAGNOSIS — M6281 Muscle weakness (generalized): Secondary | ICD-10-CM | POA: Diagnosis not present

## 2017-12-22 DIAGNOSIS — J690 Pneumonitis due to inhalation of food and vomit: Secondary | ICD-10-CM | POA: Diagnosis not present

## 2017-12-22 DIAGNOSIS — I10 Essential (primary) hypertension: Secondary | ICD-10-CM | POA: Diagnosis not present

## 2017-12-22 DIAGNOSIS — R1312 Dysphagia, oropharyngeal phase: Secondary | ICD-10-CM | POA: Diagnosis not present

## 2017-12-29 DIAGNOSIS — I10 Essential (primary) hypertension: Secondary | ICD-10-CM | POA: Diagnosis not present

## 2017-12-29 DIAGNOSIS — Z515 Encounter for palliative care: Secondary | ICD-10-CM | POA: Diagnosis not present

## 2018-01-03 ENCOUNTER — Other Ambulatory Visit: Payer: Self-pay

## 2018-01-03 NOTE — Patient Outreach (Signed)
[facility]) Care Management  01/03/2018  Nathan Saunders June 03, 1932 [id number]   Telephone call to son [name].  He is able to verify HIPAA.  Asked about his dad and status.  He reports that patient passed away on 01/13/2018.  Offered condolences to him and the family.    Plan: RN CM will close case and notify care management of case status.  Bary Lericheionne J Mattison Stuckey, RN, MSN [facility] Care Management Care Management Coordinator Direct Line [phone] Toll Free: [phone]  Fax: [phone]

## 2018-01-27 DEATH — deceased

## 2018-12-25 IMAGING — CT CT HEAD W/O CM
3 series · 15 of 47 positions shown, 18 images · non-contrast
Comparison: Head CT 01/29/2017

CLINICAL DATA: Altered mental status

EXAM:
CT HEAD WITHOUT CONTRAST
TECHNIQUE: Contiguous axial images were obtained from the base of the skull
through the vertex without intravenous contrast.

[Series 2: head wo · axial · 0.42mm/px · z∈[-87,+43]mm · 9 of 32 slices shown, 12 images]
[im 3/32  brain]
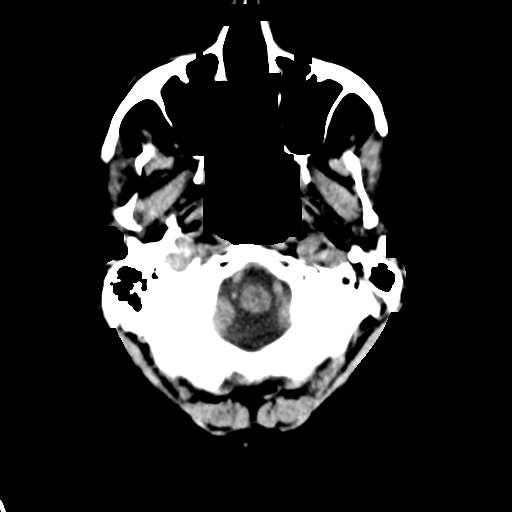
[im 3/32  bone]
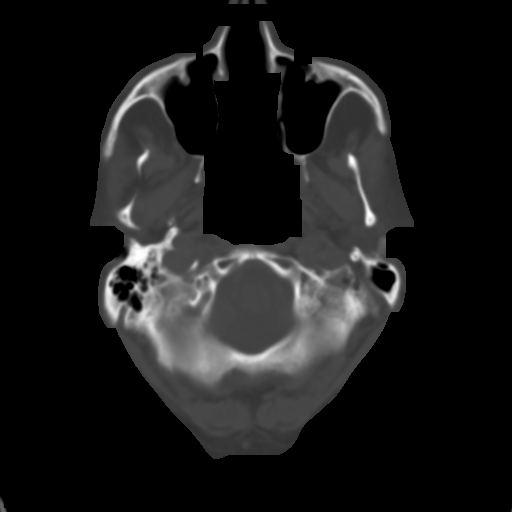
[im 6/32  brain]
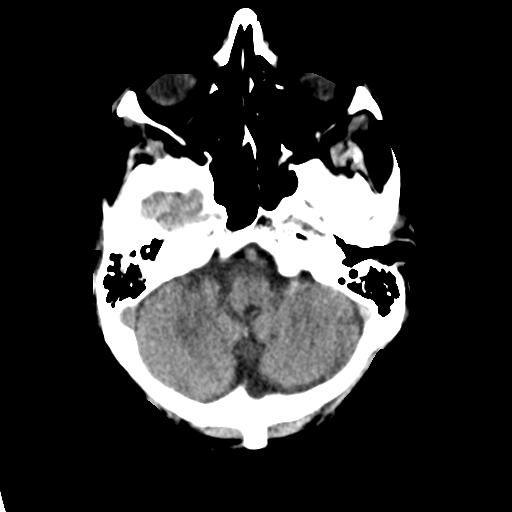
[im 9/32  brain]
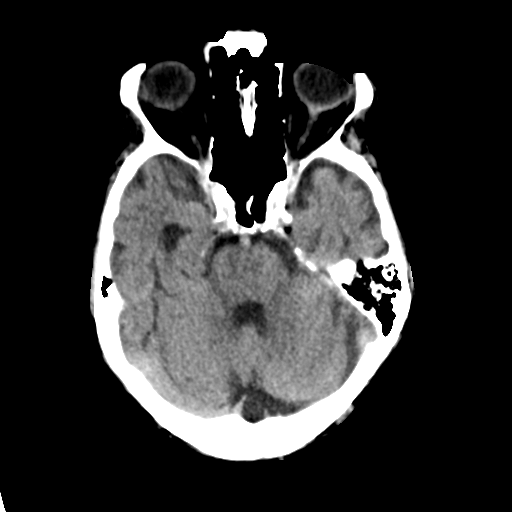
[im 12/32  brain]
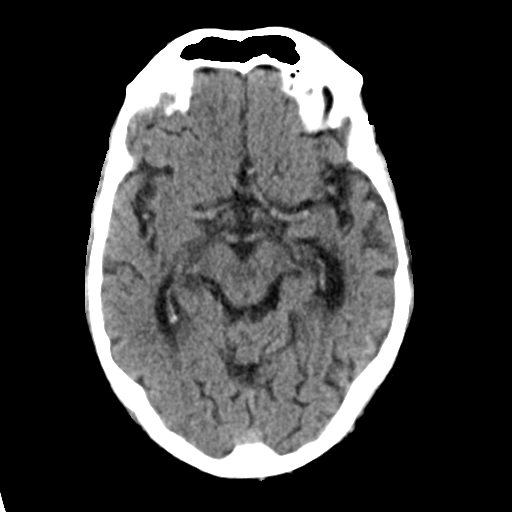
[im 17/32  brain]
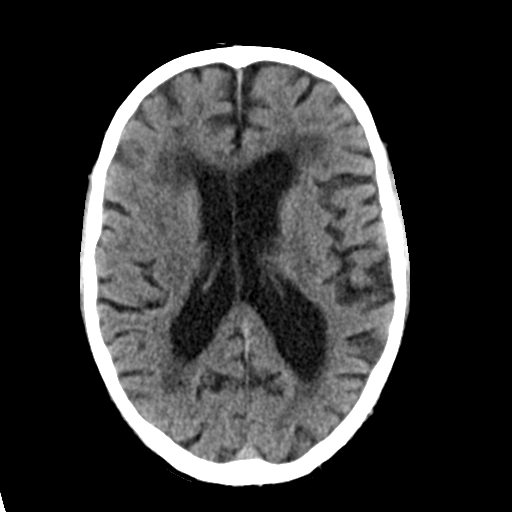
[im 17/32  bone]
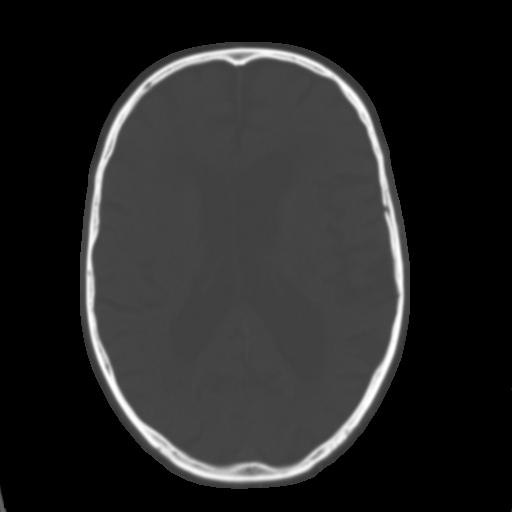
[im 20/32  brain]
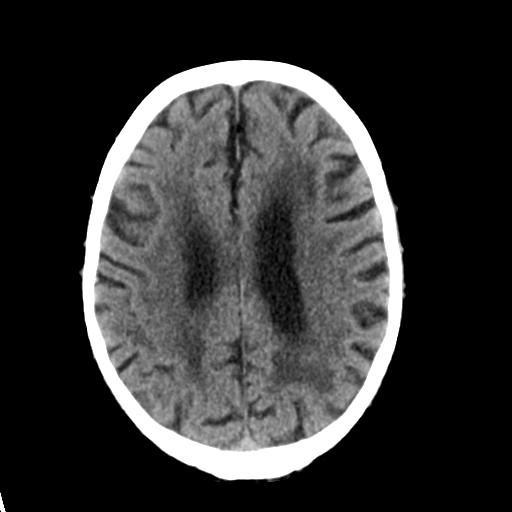
[im 23/32  brain]
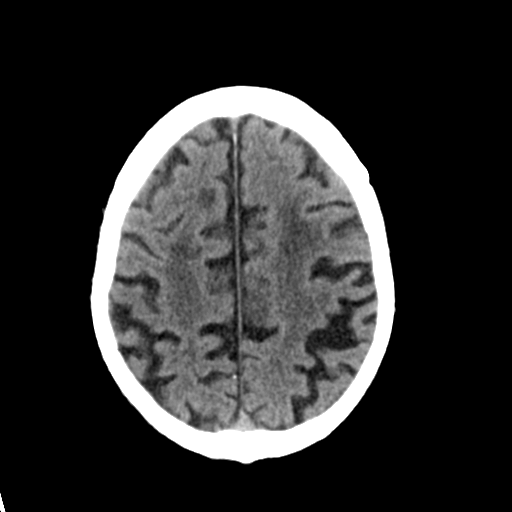
[im 26/32  brain]
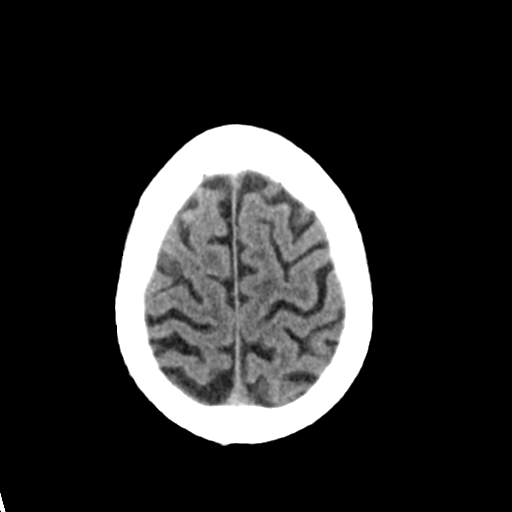
[im 29/32  brain]
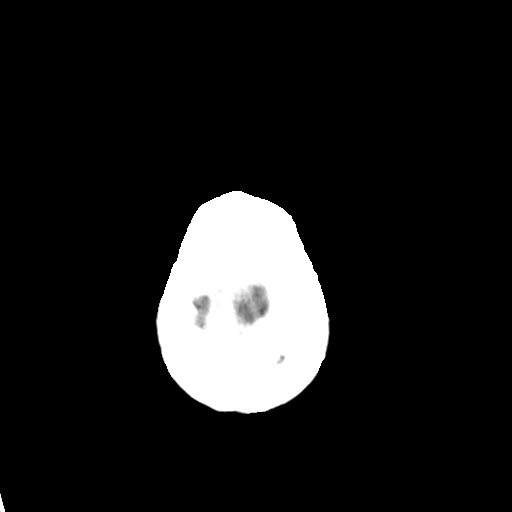
[im 29/32  bone]
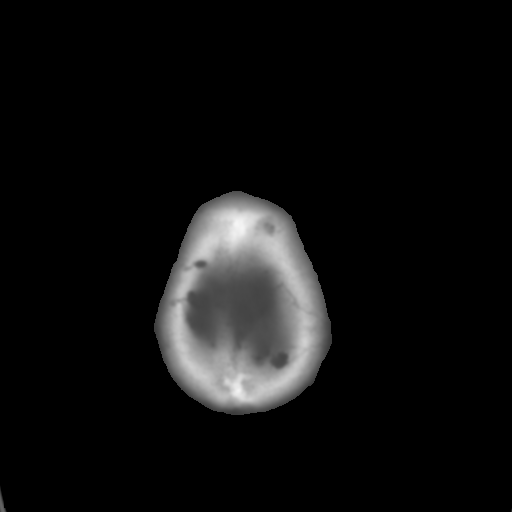

[Series 4: coronal soft tissue · coronal · 0.32mm/px · 3 of 67 slices shown]
[im 23/67  brain]
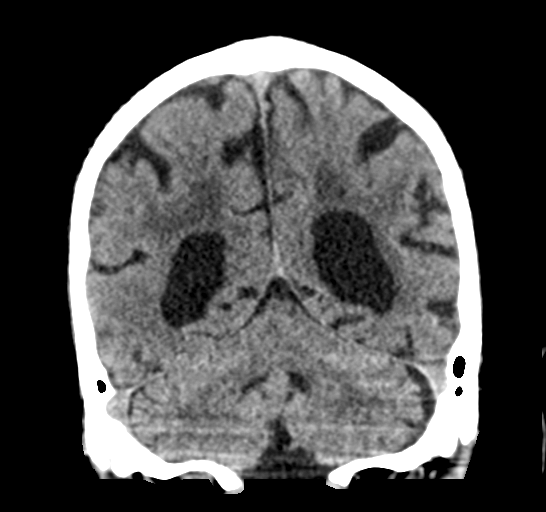
[im 30/67  brain]
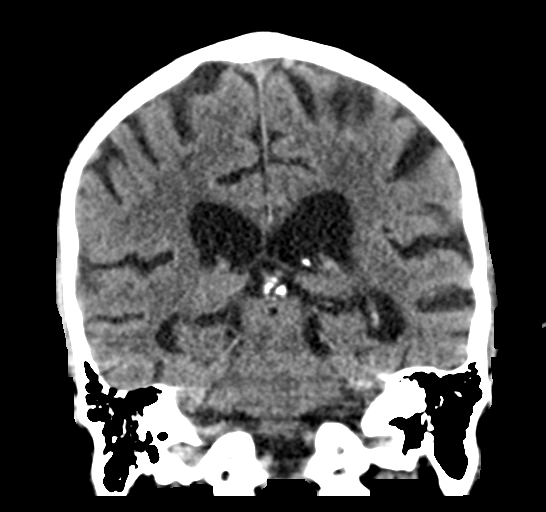
[im 37/67  brain]
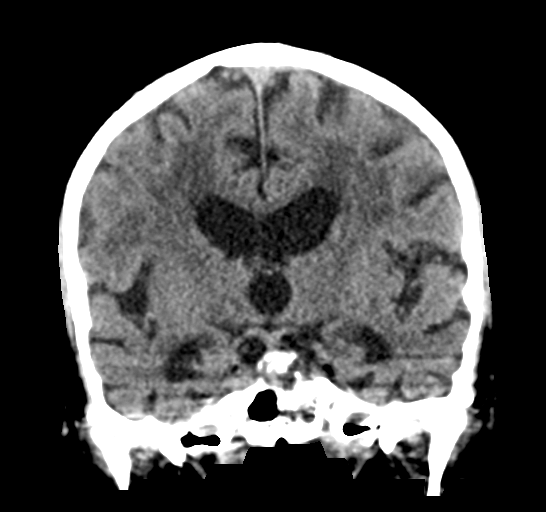

[Series 5: sagittal soft tissue · sagittal · 0.33mm/px · 3 of 50 slices shown]
[im 17/50  brain]
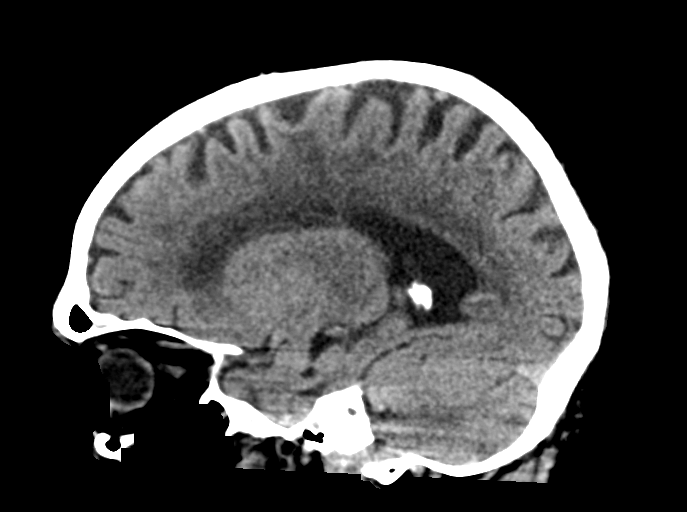
[im 25/50  brain]
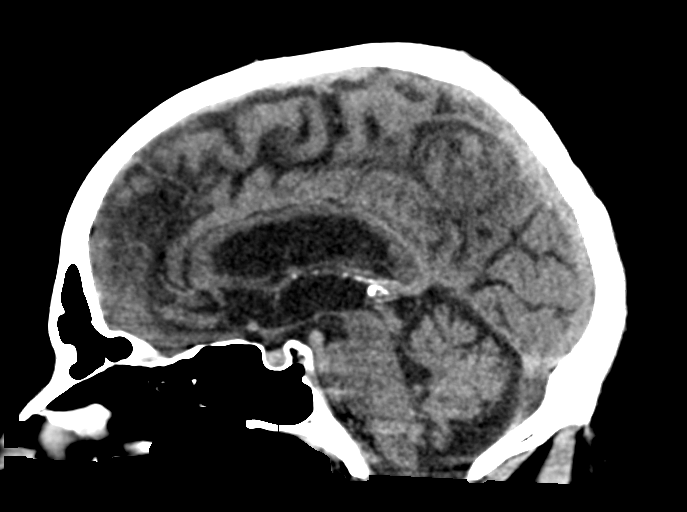
[im 33/50  brain]
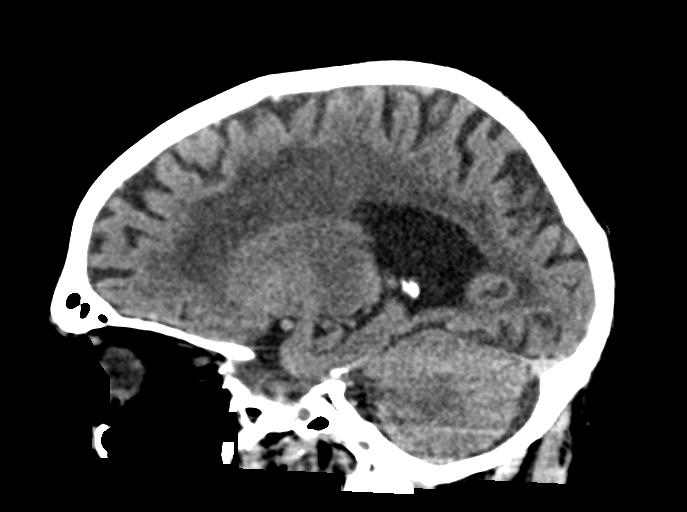

[15 of 47 positions shown; findings below may reference images not displayed]

FINDINGS: Brain: No mass lesion, intraparenchymal hemorrhage or extra-axial
collection. No evidence of acute cortical infarct. There is
periventricular hypoattenuation compatible with chronic
microvascular disease. Diffuse mild atrophy for age.

Vascular: No hyperdense vessel or unexpected calcification.

Skull: Normal visualized skull base, calvarium and extracranial soft
tissues.

Sinuses/Orbits: No sinus fluid levels or advanced mucosal
thickening. No mastoid effusion. Normal orbits.
IMPRESSION: Chronic microvascular ischemia without acute intracranial
abnormality.

## 2018-12-25 IMAGING — DX DG CHEST 1V
1 series · 1 of 1 positions shown · non-contrast
Comparison: 01/29/2017

CLINICAL DATA: Altered mentation

EXAM:
CHEST 1 VIEW

[chest ap]
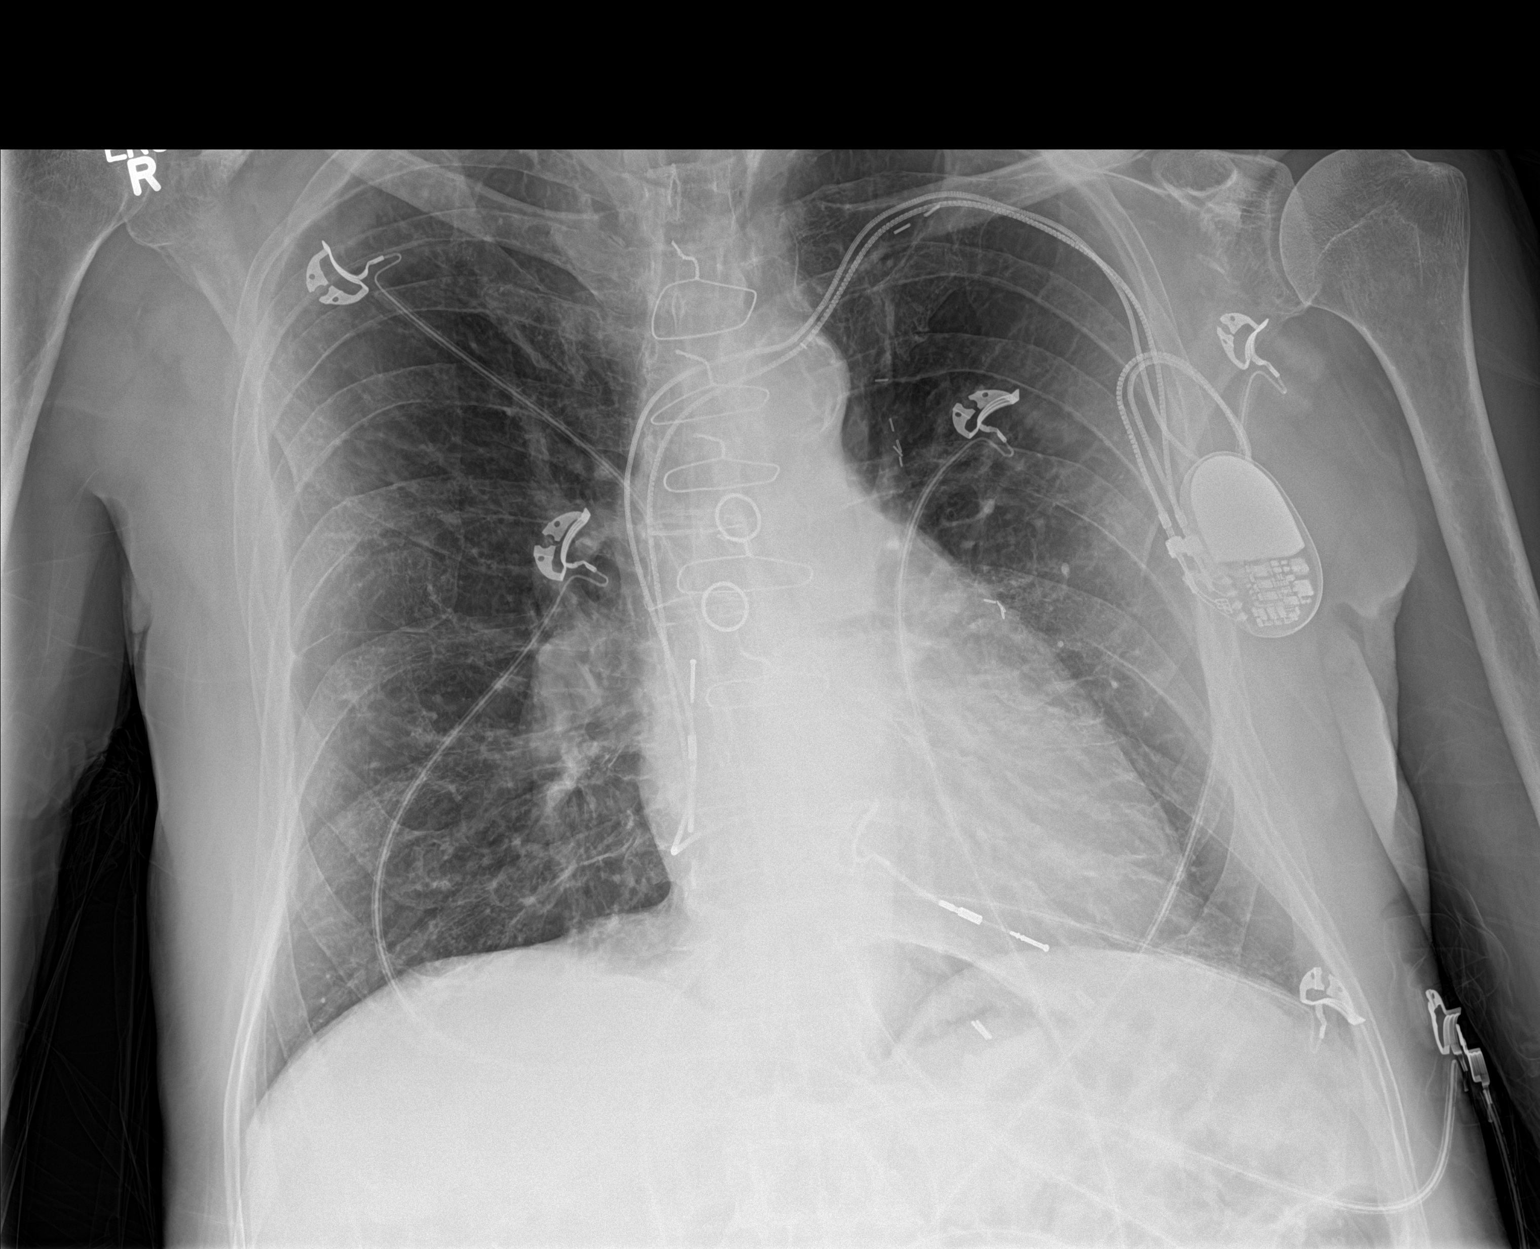

[1 of 1 positions shown; findings below may reference images not displayed]

FINDINGS: Normal sized heart with aortic atherosclerosis unchanged in
appearance. Status post CABG. Right atrial and right ventricular
leads are noted with left-sided pacemaker apparatus projecting over
the left axilla. Emphysematous hyperinflation of the lungs without
pneumonic consolidation, effusion or pneumothorax. No overt
pulmonary edema.
IMPRESSION: Chronic stable mild emphysematous hyperinflation of the lungs.
Aortic atherosclerosis. Post CABG change.
